# Patient Record
Sex: Female | Born: 1980 | Race: Black or African American | Hispanic: No | Marital: Single | Smoking: Never smoker
Health system: Southern US, Community
[De-identification: ages and names within clinical notes are randomized; demographics above are authoritative.]

## PROBLEM LIST (undated history)

## (undated) DIAGNOSIS — C8590 Non-Hodgkin lymphoma, unspecified, unspecified site: Secondary | ICD-10-CM

## (undated) DIAGNOSIS — Z9221 Personal history of antineoplastic chemotherapy: Secondary | ICD-10-CM

## (undated) DIAGNOSIS — C801 Malignant (primary) neoplasm, unspecified: Secondary | ICD-10-CM

---

## 2009-01-27 ENCOUNTER — Ambulatory Visit: Payer: Self-pay | Admitting: Internal Medicine

## 2013-10-04 ENCOUNTER — Encounter: Payer: Self-pay | Admitting: Maternal & Fetal Medicine

## 2013-10-04 LAB — COMPREHENSIVE METABOLIC PANEL
Albumin: 3.3 g/dL — ABNORMAL LOW (ref 3.4–5.0)
Alkaline Phosphatase: 57 U/L
Anion Gap: 7 (ref 7–16)
BUN: 5 mg/dL — ABNORMAL LOW (ref 7–18)
Bilirubin,Total: 0.5 mg/dL (ref 0.2–1.0)
Calcium, Total: 9.4 mg/dL (ref 8.5–10.1)
Chloride: 104 mmol/L (ref 98–107)
Co2: 25 mmol/L (ref 21–32)
Creatinine: 0.62 mg/dL (ref 0.60–1.30)
EGFR (African American): >60
EGFR (Non-African Amer.): >60
Glucose: 83 mg/dL (ref 65–99)
Osmolality: 268 (ref 275–301)
Potassium: 4 mmol/L (ref 3.5–5.1)
SGOT(AST): 17 U/L (ref 15–37)
SGPT (ALT): 25 U/L (ref 12–78)
Sodium: 136 mmol/L (ref 136–145)
Total Protein: 9.1 g/dL — ABNORMAL HIGH (ref 6.4–8.2)

## 2013-10-04 LAB — HEMOGLOBIN A1C: Hemoglobin A1C: 5.5 % (ref 4.2–6.3)

## 2013-10-15 DIAGNOSIS — D259 Leiomyoma of uterus, unspecified: Secondary | ICD-10-CM | POA: Insufficient documentation

## 2013-10-16 DIAGNOSIS — C8190 Hodgkin lymphoma, unspecified, unspecified site: Secondary | ICD-10-CM | POA: Insufficient documentation

## 2013-10-18 ENCOUNTER — Encounter: Payer: Self-pay | Admitting: Obstetrics and Gynecology

## 2014-01-07 DIAGNOSIS — Z9221 Personal history of antineoplastic chemotherapy: Secondary | ICD-10-CM | POA: Insufficient documentation

## 2014-04-23 DIAGNOSIS — Z98891 History of uterine scar from previous surgery: Secondary | ICD-10-CM | POA: Insufficient documentation

## 2014-07-20 NOTE — Consult Note (Signed)
Referral Information:  Reason for Referral 34 yo gravida 1 at 71w3dby dates is referred due to history of Hodgkin's disease   Referring Physician CLouisa SecondCNM   Home Medications: Medication Instructions Status  Tylenol 500 mg oral tablet 2 tab(s) orally every 6 hours, As Needed - for Pain Active  Prenatal Multivitamins Prenatal Multivitamins oral tablet 1 tab(s) orally once a day Active  folic acid 1 mg oral tablet 1  orally once a day Active  Aspir 81 81 mg oral tablet 1 tab(s) orally once a day Active   Allergies:   No Known Allergies:   Vital Signs/Notes:  Nursing Vital Signs: **Vital Signs.:   09-Jul-15 08:55  Vital Signs Type Routine  Temperature Temperature (F) 98.3  Temperature Source oral  Pulse Pulse 79  Respirations Respirations 18  Systolic BP Systolic BP 1659 Diastolic BP (mmHg) Diastolic BP (mmHg) 57  Pulse Ox % Pulse Ox % 98  Pulse Ox Activity Level  At rest  Oxygen Delivery Room Air/ 21 %   Perinatal Consult:  LMP 16-Jul-2013   PGyn Hx Last Pap 2012   Past Medical History cont'd 1999 - Hodgkin's lymphoma - presented with weight loss, night sweats and abdominal mass.  Underwent exploratory laparotomy. Hospitalized for 4 weeks.  Chemo q 2 weeks for 6 months.  Knows she had adriamycin.  Doesn't know other agents.  Last oncology follow-up was in 2000.  Saw primary care physician one year ago.   PSurg Hx Exploratory laparotomy 1999   FHx No family history of diabetes, heart disease, clots, strokes or birth defects.   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  Twin brother died of hypertensive renal disease.   Soc Hx Engaged.  No substances.   Review Of Systems:  Subjective For last few days has had neuropathic pain radiating down right leg. Getting better.   Fever/Chills No   Cough No   Abdominal Pain No   Diarrhea No   Constipation No   Nausea/Vomiting No    SOB/DOE No   Chest Pain No   Dysuria No   Tolerating Diet Yes   Medications/Allergies Reviewed Medications/Allergies reviewed   Exam:  Today's Weight 280     Hepatic:  09-Jul-15 11:25   Bilirubin, Total 0.5  Alkaline Phosphatase 57 (45-117 NOTE: New Reference Range 02/16/13)  SGPT (ALT) 25  SGOT (AST) 17  Total Protein, Serum  9.1  Albumin, Serum  3.3  Routine Chem:  09-Jul-15 11:25   Hemoglobin A1c ([facility]) 5.5 (The American Diabetes Association recommends that a primary goal of therapy should be <7% and that physicians should reevaluate the treatment regimen in patients with HbA1c values consistently >8%.)  Glucose, Serum 83  BUN  5  Creatinine (comp) 0.62  Sodium, Serum 136  Potassium, Serum 4.0  Chloride, Serum 104  CO2, Serum 25  Calcium (Total), Serum 9.4  Osmolality (calc) 268  eGFR (African American) >60  eGFR (Non-African American) >60 (eGFR values <690mmin/1.73 m2 may be an indication of chronic kidney disease (CKD). Calculated eGFR is useful in patients with stable renal function. The eGFR calculation will not be reliable in acutely ill patients when serum creatinine is changing rapidly. It is not useful in  patients on dialysis. The eGFR calculation may not be applicable to patients at the low and high extremes of body sizes, pregnant women, and vegetarians.)  Anion Gap 7   [place]  09-Jul-15 10:56, MFM OB US < 14 Wks, Single Fetus  MFM OB US < 14 Wks, Single Fetus   Indication: dating.   ____________________________________________________________________________  History: Age: 51 years. [facility] age at [facility]: 46 years.  ____________________________________________________________________________  Dating:  LMP:   07/16/2013     EDC:     04/22/2014     GA by LMP:     [redacted]w[redacted]d Current Scan on:     10/04/2013     EDC:     04/30/2014     GA by current scan:      169w2dBest Overall Assessment:     10/04/2013     EDC:     04/30/2014     Assessed  GA:      1071w2dhe calculation of the gestational age by current scan was based on CRLMarneThe Best Overall Assessment is based on the ultrasound examination on  10/04/2013.  ____________________________________________________________________________  First Trimester Scan:  Singleton gestation.  Biometry:  CRL     36.0     mm      -     10w68w2dtal Anatomy:  Skull / Brain: not examined. Spine: not examined. Heart: not examined. Abdomen:  not examined. Stomach: not examined. Bladder: not visible. Hands: not examined.  Feet: not examined.      Fetal heart activity: present. Fetal heart rate: 152 bpm.     ____________________________________________________________________________  Maternal Structures:  Cervix: Finding: Multiple fibroids seen in uterus.  Right Ovary: normal.   Right Ovary size: 28 mm x 17 mm x 23 mm. Volume: 5.7 ml.   Left Ovary: abnormal.   Left Ovary size: 39 mm x 31 mm x 33 mm. Volume: 20.9 ml.  Comments: Left ovarian cyst which measures 21 X 19 X 20 mm.  There are 4 fibroids:  Left measuring 7 x 7.1 x 7.7 cm  Midline measuring 2.7 cm x 2.2 cm x 2.3 cm  Right  measuring 4.2 x 3.9 x 4.5 cm  Lower uterine segment measuring 4.6 cm x 4.8 cm x 5.7 cm  ____________________________________________________________________________  Report Summary:  Impression: Single intrauterine pregnancy with an estimated gestational age of   10 w24 weeksay(s).  Dating assigned by today's ultrasound.     Referred due to history of Hodgkin's disease. CODING DESCRIPTION:76801  First trimester ultrasound. Unsure LMP.   Recommendations: A full Perinatal Consult will follow.  Given the patient's  multiple risk factors, I [facility] Thank you for allowing us t[place]articipate in her care.  Electronically signed by:  AndrErasmo Score   Impression/Recommendations:  Impression 33 y89gravida 1 at 10w259w2dation dated by US  tKoreaay with: 1. AMA 2. History of Hodgkins with adriamycin exposure and other unknown chemotherapy exposures 3. Obesity 4. Multiple fibroids (7 cm, 3 cm, 4 cm, and 5cm in diameter) 5. Multiple risk factors  for maternal cardiomyopathy (adriamycin exposure, morbid obesity, AMA, AA race)   Recommendations 1. AMA --The patient met with our genetic counselor, DeborDonette Larryy, and elects first trimester screening.  [facility]; other unknown exposures putting patient at possible risk for pulmonary fibrosis; other risk factors for matenal cardiomyopathy  --EKG --Echo --PFTs --Repeat echo at 28-30 weeks --consultation with cardiology prn depending on echo results 3. Morbid obesity --HbA1C today --Early and  routine glucose screening --Monthly [place] starting at [redacted] weeks gestation, weekly testing starting at [redacted] weeks gestation, delivery at 39-[redacted] weeks gestation 4. Multiple fibroids --Reassessment during periodic ultrasounds 5. Multiple risk factors --I would recommend she be delivered at a tertiary care center and have MFM supervision during pregnancy.  I suggested [facility] or [facility].  [facility] in [facility], so we will arrange a transfer of care appointment for her.   Plan:  Genetic Counseling yes, today   Prenatal Diagnosis Options First trimester   Ultrasound at what gestational ages Monthly > 28 weeks   Antepartum Testing Weekly, Starting at 78 to 36 weeks   Delivery Mode Depending on circumstances at the time   Additional Testing HbA1C   Delivery at what gestational age [redacted] weeks   Comment/Plan Thank you for allowing [place] to participate in her care.    Comments I had the opportunity to speak to Dr. Glennon Mac about the recommendations above and he is in agreement.    Total Time Spent with Patient > 60 minutes   >50% of visit spent in couseling/coordination of care yes    Coding Description: MATERNAL CONDITIONS/HISTORY INDICATION(S).   Adv Maternal Age- primigravida.   Fibroids.   Obesity - BMI greater than equal to 30.   OTHER: History of Hodgkin's disease with adriamycin exposure.  Electronic Signatures: Dellia Nims (MD)  (Signed 09-Jul-15 15:10)  Authored: Referral, Home Medications, Allergies, Vital Signs/Notes, Consult, Exam, Lab, Radiology, Impression, Plan, Other Comments, Billing, Coding Description   Last Updated: 09-Jul-15 15:10 by Dellia Nims (MD)

## 2015-07-26 ENCOUNTER — Emergency Department: Payer: BC Managed Care – PPO

## 2015-07-26 ENCOUNTER — Encounter: Payer: Self-pay | Admitting: Emergency Medicine

## 2015-07-26 ENCOUNTER — Emergency Department
Admission: EM | Admit: 2015-07-26 | Discharge: 2015-07-26 | Disposition: A | Discharge status: Home / Self Care | Payer: BC Managed Care – PPO | Attending: Emergency Medicine | Admitting: Emergency Medicine

## 2015-07-26 DIAGNOSIS — Y999 Unspecified external cause status: Secondary | ICD-10-CM | POA: Diagnosis not present

## 2015-07-26 DIAGNOSIS — S8392XA Sprain of unspecified site of left knee, initial encounter: Secondary | ICD-10-CM | POA: Insufficient documentation

## 2015-07-26 DIAGNOSIS — Z8572 Personal history of non-Hodgkin lymphomas: Secondary | ICD-10-CM | POA: Diagnosis not present

## 2015-07-26 DIAGNOSIS — Y9389 Activity, other specified: Secondary | ICD-10-CM | POA: Diagnosis not present

## 2015-07-26 DIAGNOSIS — M79605 Pain in left leg: Secondary | ICD-10-CM | POA: Diagnosis present

## 2015-07-26 DIAGNOSIS — X501XXA Overexertion from prolonged static or awkward postures, initial encounter: Secondary | ICD-10-CM | POA: Diagnosis not present

## 2015-07-26 DIAGNOSIS — Y929 Unspecified place or not applicable: Secondary | ICD-10-CM | POA: Diagnosis not present

## 2015-07-26 HISTORY — DX: Malignant (primary) neoplasm, unspecified: C80.1

## 2015-07-26 HISTORY — DX: Non-Hodgkin lymphoma, unspecified, unspecified site: C85.90

## 2015-07-26 MED ORDER — TRAMADOL HCL 50 MG PO TABS: 50.0000 mg | ORAL_TABLET | Freq: Four times a day (QID) | ORAL | Status: DC | PRN

## 2015-07-26 MED ORDER — TRAMADOL HCL 50 MG PO TABS
50.0000 mg | ORAL_TABLET | Freq: Once | ORAL | Status: AC
  Administered 2015-07-26: 50 mg via ORAL
  Filled 2015-07-26: qty 1

## 2015-07-26 MED ORDER — NAPROXEN 500 MG PO TABS
500.0000 mg | ORAL_TABLET | Freq: Once | ORAL | Status: AC
  Administered 2015-07-26: 500 mg via ORAL
  Filled 2015-07-26: qty 1

## 2015-07-26 MED ORDER — NAPROXEN 500 MG PO TABS: 500.0000 mg | ORAL_TABLET | Freq: Two times a day (BID) | ORAL | Status: DC

## 2015-07-26 NOTE — ED Notes (Addendum)
Pt to ed with c/o left leg pain that started on Tuesday am,  Pt states increased pain with ambulation and ROM makes it worse.   Denies injury.

## 2015-07-26 NOTE — ED Provider Notes (Signed)
[facility] Emergency Department Provider Note  ____________________________________________  Time seen: Approximately 3:43 PM  I have reviewed the triage vital signs and the nursing notes.   HISTORY  Chief Complaint Leg Pain    HPI Christina Sanders is a 35 y.o. female patient complaining of 4 days of left knee pain. Patient stated awaken 4 days ago with this complaint.Patient denies any provocative incident for her complaint. She states a long-standing the day before but went to bed without any pain. Patient stated pain increases with ambulation and flexion of the left knee. Patient stated pain is concentrated in the popliteal space and also the lateral left knee. Patient states no relief with over-the-counter anti-inflammatory medications. Patient rates the pain as a 10 over 10. Patient described a pain as "shooting". Patient is morbidly obese.   Past Medical History  Diagnosis Date  . Cancer ([place])   . Non Hodgkin's lymphoma ([place])     There are no active problems to display for this patient.   History reviewed. No pertinent past surgical history.  Current Outpatient Rx  Name  Route  Sig  Dispense  Refill  . naproxen (NAPROSYN) 500 MG tablet   Oral   Take 1 tablet (500 mg total) by mouth 2 (two) times daily with a meal.   20 tablet   0   . traMADol (ULTRAM) 50 MG tablet   Oral   Take 1 tablet (50 mg total) by mouth every 6 (six) hours as needed for moderate pain.   12 tablet   0     Allergies Heparin  History reviewed. No pertinent family history.  Social History Social History  Substance Use Topics  . Smoking status: Never Smoker   . Smokeless tobacco: None  . Alcohol Use: No    Review of Systems Constitutional: No fever/chills Eyes: No visual changes. ENT: No sore throat. Cardiovascular: Denies chest pain. Respiratory: Denies shortness of breath. Gastrointestinal: No abdominal pain.  No nausea, no vomiting.  No diarrhea.  No  constipation. Genitourinary: Negative for dysuria. Musculoskeletal: Positive for left knee pain  Skin: Negative for rash. Neurological: Negative for headaches, focal weakness or numbness. Hematological/Lymphatic:Non-Hodgkin's lymphoma  Allergic/Immunilogical: Heparin ____________________________________________   PHYSICAL EXAM:  VITAL SIGNS: ED Triage Vitals  Enc Vitals Group     BP 07/26/15 1412 137/85 mmHg     Pulse Rate 07/26/15 1412 84     Resp 07/26/15 1412 18     Temp 07/26/15 1412 97.8 F (36.6 C)     Temp Source 07/26/15 1412 Oral     SpO2 07/26/15 1412 100 %     Weight 07/26/15 1412 287 lb (130.182 kg)     Height 07/26/15 1412 5\' 5"  (1.651 m)     Head Cir --      Peak Flow --      Pain Score 07/26/15 1413 10     Pain Loc --      Pain Edu? --      Excl. in [place]? --     Constitutional: Alert and oriented. Well appearing and in no acute distress. Morbid obesity Eyes: Conjunctivae are normal. PERRL. EOMI. Head: Atraumatic. Nose: No congestion/rhinnorhea. Mouth/Throat: Mucous membranes are moist.  Oropharynx non-erythematous. Neck: No stridor.   cervical spine tenderness to palpation. Hematological/Lymphatic/Immunilogical: No cervical lymphadenopathy. Cardiovascular: Normal rate, regular rhythm. Grossly normal heart sounds.  Good peripheral circulation. Respiratory: Normal respiratory effort.  No retractions. Lungs CTAB. Gastrointestinal: Soft and nontender. No distention. No abdominal bruits. No  CVA tenderness. Musculoskeletal: No obvious deformity of the left lower extremity. Exam is limited by patient body habitus. Patient has some moderate guarding palpation the left popliteal area in the lateral aspect of the inferior patella. Patient is ambulating with atypical gait  Neurologic:  Normal speech and language. No gross focal neurologic deficits are appreciated. No gait instability. Skin:  Skin is warm, dry and intact. No rash noted. Psychiatric: Mood and affect are  normal. Speech and behavior are normal.  ____________________________________________   LABS (all labs ordered are listed, but only abnormal results are displayed)  Labs Reviewed - No data to display ____________________________________________  EKG   ____________________________________________  RADIOLOGY  No acute findings on x-ray of the left knee. Findings consistent with a previous ligament injury. ____________________________________________   PROCEDURES  Procedure(s) performed: None  Critical Care performed: No  ____________________________________________   INITIAL IMPRESSION / ASSESSMENT AND PLAN / ED COURSE  Pertinent labs & imaging results that were available during my care of the patient were reviewed by me and considered in my medical decision making (see chart for details).  Sprain left knee. Discussed x-ray findings with patient. Patient given discharge care instructions. Patient given prescription for naproxen and tramadol. Patient advised follow "clinic if no improvement in 3-5 days. ____________________________________________   FINAL CLINICAL IMPRESSION(S) / ED DIAGNOSES  Final diagnoses:  Left knee sprain, initial encounter      Sable Feil, PA-C 07/26/15 [place], MD 07/27/15 [phone]

## 2016-06-06 ENCOUNTER — Emergency Department (HOSPITAL_COMMUNITY)
Admission: EM | Admit: 2016-06-06 | Discharge: 2016-06-06 | Disposition: A | Discharge status: Home / Self Care | Payer: BC Managed Care – PPO | Attending: Emergency Medicine | Admitting: Emergency Medicine

## 2016-06-06 ENCOUNTER — Emergency Department (HOSPITAL_COMMUNITY): Payer: BC Managed Care – PPO

## 2016-06-06 ENCOUNTER — Encounter (HOSPITAL_COMMUNITY): Payer: Self-pay | Admitting: Emergency Medicine

## 2016-06-06 DIAGNOSIS — M25511 Pain in right shoulder: Secondary | ICD-10-CM | POA: Diagnosis not present

## 2016-06-06 DIAGNOSIS — R202 Paresthesia of skin: Secondary | ICD-10-CM

## 2016-06-06 DIAGNOSIS — Z8572 Personal history of non-Hodgkin lymphomas: Secondary | ICD-10-CM | POA: Diagnosis not present

## 2016-06-06 DIAGNOSIS — Z79899 Other long term (current) drug therapy: Secondary | ICD-10-CM | POA: Insufficient documentation

## 2016-06-06 NOTE — ED Notes (Signed)
Pt c/o hand being weak since Thursday and feeling weak in right arm. Pt reports numbness and pain. She states she has to keep hand by side, decreased ROM.

## 2016-06-06 NOTE — ED Notes (Signed)
ED Provider at bedside. 

## 2016-06-06 NOTE — Discharge Instructions (Signed)
Remove sling multiple times a day to stretch your arm.

## 2016-06-06 NOTE — ED Triage Notes (Signed)
Pt presents to ED for assessment of intermittent finger tingling/numbness, with right posterior arm pain and shoulder pain radiating up to her neck.  Pt denies any injury.  Patient seen at [facility] earlier today for same.

## 2016-06-06 NOTE — ED Notes (Signed)
Pt denies symptoms of blurry vision, N/V, or headaches.

## 2016-06-06 NOTE — ED Provider Notes (Signed)
[facility] DEPT Provider Note   CSN: [id number] Arrival date & time: 06/06/16  1944   By signing my name below, I, Christina Sanders, attest that this documentation has been prepared under the direction and in the presence of Wyn Quaker, PA-C Electronically Signed: [place], ED Scribe. 06/06/16. 9:02 PM.  History   Chief Complaint Chief Complaint  Patient presents with  . Shoulder Pain  . Arm Injury    HPI Christina Sanders is a 36 y.o. female with a PMHx of non-hodgkin's lymphoma, CA, who presents to the Emergency Department complaining of right shoulder pain onset 1 year worsening 3 days ago. Pt reports associated tingling sensation to right fingertips. Pt has tried Rx naprosyn, aleve, icy-hot patch, and warm showers with no relief of her symptoms. Pt right shoulder pain radiates to her right fingers and she describes it as a shooting sensation. She notes that her right shoulder pain is worsened with laying down and movement. 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 She states that she is right hand dominant. She states that she was evaluated at Urgent Care this morning, prescribed naproxen, and informed to follow up in the ED for worsening symptoms. She denies numbness, bowel/bladder incontinence, HA, blurred vision, vision change, nausea, vomiting, diarrhea, cough, sneezing and any other symptoms.     The history is provided by the patient. No language interpreter was used.    Past Medical History:  Diagnosis Date  . Cancer ([place])   . Non Hodgkin's lymphoma ([place])     There are no active problems to display for this patient.   History reviewed. No pertinent surgical history.  OB History    Gravida Para Term Preterm AB Living   1         1   SAB TAB Ectopic Multiple Live Births                   Home Medications    Prior to Admission medications   Medication Sig Start Date End Date Taking? Authorizing Provider    naproxen (NAPROSYN) 500 MG tablet Take 1 tablet (500 mg total) by mouth 2 (two) times daily with a meal. 07/26/15   Sable Feil, PA-C  traMADol (ULTRAM) 50 MG tablet Take 1 tablet (50 mg total) by mouth every 6 (six) hours as needed for moderate pain. 07/26/15   Sable Feil, PA-C    Family History History reviewed. No pertinent family history.  Social History Social History  Substance Use Topics  . Smoking status: Never Smoker  . Smokeless tobacco: Never Used  . Alcohol use No     Allergies   Heparin   Review of Systems Review of Systems  Constitutional: Negative for fever.  HENT: Negative for sneezing.   Eyes: Negative for visual disturbance.  Respiratory: Negative for cough.   Cardiovascular: Negative for chest pain.  Gastrointestinal: Negative for diarrhea, nausea and vomiting.  Musculoskeletal: Positive for arthralgias (right shoulder) and myalgias. Negative for back pain, gait problem and joint swelling.  Skin: Negative for color change.  Neurological: Negative for numbness and headaches.       +Tingling to right fingers      Physical Exam Updated Vital Signs BP (!) 137/103 (BP Location: Left Arm)   Pulse 87   Temp 99.2 F (37.3 C) (Oral)   Resp 18   LMP 05/27/2016   SpO2 100%   Physical Exam  Constitutional: She is oriented to  person, place, and time. She appears well-developed and well-nourished. No distress.  HENT:  Head: Normocephalic and atraumatic.  Eyes: EOM are normal.  Neck: Neck supple.  Cardiovascular: Normal rate.   Pulmonary/Chest: Effort normal. No respiratory distress.  Abdominal: She exhibits no distension.  Musculoskeletal: She exhibits tenderness.       Right shoulder: She exhibits decreased range of motion and tenderness. She exhibits no deformity.  TTP right shoulder. Good grip strength. Good ROM to hands and wrist. Extremities are warm and well perfused. ROM of R shoulder limited secondary to pain.  Passive ROM of R elbow  intact.  Neurological: She is alert and oriented to person, place, and time.  Skin: Skin is warm and dry. She is not diaphoretic.  Psychiatric: She has a normal mood and affect. Her behavior is normal.  Nursing note and vitals reviewed.    ED Treatments / Results  DIAGNOSTIC STUDIES: Oxygen Saturation is 100% on RA, nl by my interpretation.    COORDINATION OF CARE: 9:01 PM Discussed treatment plan with pt at bedside which includes right shoulder xray and pt agreed to plan.  Radiology Dg Shoulder Right  Result Date: 06/06/2016 CLINICAL DATA:  Pain, numbness, and tingling. Right posterior arm and shoulder pain radiating to the neck. Seen at urgent care center earlier today for same. EXAM: RIGHT SHOULDER - 2+ VIEW COMPARISON:  None. FINDINGS: Examination is limited due to body habitus and patient positioning. There is no evidence of fracture or dislocation. There is no evidence of arthropathy or other focal bone abnormality. Soft tissues are unremarkable. IMPRESSION: No acute bony abnormalities identified. Electronically Signed   By: Lucienne Capers M.D.   On: 06/06/2016 21:16    Procedures Procedures (including critical care time)  Medications Ordered in ED Medications - No data to display   Initial Impression / Assessment and Plan / ED Course  I have reviewed the triage vital signs and the nursing notes.  Pertinent imaging results that were available during my care of the patient were reviewed by me and considered in my medical decision making (see chart for details).   MDM: pain and spasm noted right shoulder area. PE shows no instability,  or deformity of acromioclavicular and sternoclavicular joints, the cervical spine, glenohumeral joint, coracoid process, acromion, or scapula. Exam Limited by body habitus. Patient has no history of trauma, and has increased use of R extremity at work.  It is felt that this pain is secondary to overuse and increase in repetitive motions.  Patient  given a sling for comfort with strict instructions to remove it multiple times a day and perform light stretching exercises to avoid frozen shoulder. While she has the neurological symptom of tingling and pain shooting down her arm, she has no other neurological symptoms that would suggest a central neurological cause.  Patient was instructed to use OTC NSAIDS for pain and inflammation control along with heat and rest.  Patient stable for discharge, given strict return precautions and voiced her understanding.   Final Clinical Impressions(s) / ED Diagnoses   Final diagnoses:  Right shoulder pain, unspecified chronicity  Tingling of right upper extremity    New Prescriptions Discharge Medication List as of 06/06/2016  9:40 PM     I personally performed the services described in this documentation, which was scribed in my presence. The recorded information has been reviewed and is accurate. Lorin Glass, [place], [place] 06/06/16 2226    Charlesetta Shanks, MD  06/08/16 0817  

## 2016-06-06 NOTE — ED Notes (Addendum)
Went to get pt from waiting room and pt is in radiology, they will bring her to room when they are done.

## 2016-06-06 NOTE — Progress Notes (Signed)
Orthopedic Tech Progress Note Patient Details:  Christina Sanders 1981-03-29 [id number]  Ortho Devices Type of Ortho Device: Arm sling Ortho Device/Splint Location: rue Ortho Device/Splint Interventions: Ordered, Application   Karolee Stamps 06/06/2016, 10:11 PM

## 2019-02-05 ENCOUNTER — Other Ambulatory Visit: Payer: Self-pay

## 2019-02-05 DIAGNOSIS — Z20822 Contact with and (suspected) exposure to covid-19: Secondary | ICD-10-CM

## 2019-02-06 LAB — NOVEL CORONAVIRUS, NAA: SARS-CoV-2, NAA: NOT DETECTED

## 2019-04-30 ENCOUNTER — Other Ambulatory Visit: Payer: Self-pay | Admitting: Family Medicine

## 2019-06-20 ENCOUNTER — Other Ambulatory Visit: Payer: Self-pay | Admitting: Family Medicine

## 2019-06-20 DIAGNOSIS — Z1231 Encounter for screening mammogram for malignant neoplasm of breast: Secondary | ICD-10-CM

## 2019-08-24 ENCOUNTER — Encounter: Payer: Self-pay | Admitting: Radiology

## 2019-08-24 ENCOUNTER — Ambulatory Visit
Admission: RE | Admit: 2019-08-24 | Discharge: 2019-08-24 | Disposition: A | Discharge status: Home / Self Care | Payer: BC Managed Care – PPO | Source: Ambulatory Visit | Attending: Family Medicine | Admitting: Family Medicine

## 2019-08-24 DIAGNOSIS — Z1231 Encounter for screening mammogram for malignant neoplasm of breast: Secondary | ICD-10-CM | POA: Insufficient documentation

## 2019-08-24 HISTORY — DX: Personal history of antineoplastic chemotherapy: Z92.21

## 2019-08-28 ENCOUNTER — Other Ambulatory Visit: Payer: Self-pay | Admitting: Family Medicine

## 2019-08-28 DIAGNOSIS — N6310 Unspecified lump in the right breast, unspecified quadrant: Secondary | ICD-10-CM

## 2019-08-28 DIAGNOSIS — N6320 Unspecified lump in the left breast, unspecified quadrant: Secondary | ICD-10-CM

## 2019-08-28 DIAGNOSIS — R928 Other abnormal and inconclusive findings on diagnostic imaging of breast: Secondary | ICD-10-CM

## 2019-08-29 ENCOUNTER — Ambulatory Visit
Admission: RE | Admit: 2019-08-29 | Discharge: 2019-08-29 | Disposition: A | Discharge status: Home / Self Care | Payer: BC Managed Care – PPO | Source: Ambulatory Visit | Attending: Family Medicine | Admitting: Family Medicine

## 2019-08-29 DIAGNOSIS — N6310 Unspecified lump in the right breast, unspecified quadrant: Secondary | ICD-10-CM

## 2019-08-29 DIAGNOSIS — N6320 Unspecified lump in the left breast, unspecified quadrant: Secondary | ICD-10-CM

## 2019-08-29 DIAGNOSIS — R928 Other abnormal and inconclusive findings on diagnostic imaging of breast: Secondary | ICD-10-CM

## 2019-08-31 ENCOUNTER — Other Ambulatory Visit: Payer: Self-pay | Admitting: Family Medicine

## 2019-08-31 DIAGNOSIS — N6320 Unspecified lump in the left breast, unspecified quadrant: Secondary | ICD-10-CM

## 2019-08-31 DIAGNOSIS — R928 Other abnormal and inconclusive findings on diagnostic imaging of breast: Secondary | ICD-10-CM

## 2019-09-04 ENCOUNTER — Ambulatory Visit
Admission: RE | Admit: 2019-09-04 | Discharge: 2019-09-04 | Disposition: A | Discharge status: Home / Self Care | Payer: BC Managed Care – PPO | Source: Ambulatory Visit | Attending: Family Medicine | Admitting: Family Medicine

## 2019-09-04 DIAGNOSIS — N6320 Unspecified lump in the left breast, unspecified quadrant: Secondary | ICD-10-CM | POA: Diagnosis present

## 2019-09-04 DIAGNOSIS — R928 Other abnormal and inconclusive findings on diagnostic imaging of breast: Secondary | ICD-10-CM

## 2019-09-04 HISTORY — PX: BREAST BIOPSY: SHX20

## 2019-09-06 LAB — SURGICAL PATHOLOGY

## 2020-04-30 ENCOUNTER — Other Ambulatory Visit: Payer: Self-pay | Admitting: Family Medicine

## 2020-04-30 DIAGNOSIS — R928 Other abnormal and inconclusive findings on diagnostic imaging of breast: Secondary | ICD-10-CM

## 2020-05-08 ENCOUNTER — Other Ambulatory Visit: Payer: Self-pay

## 2020-05-08 ENCOUNTER — Other Ambulatory Visit: Payer: Self-pay | Admitting: Family Medicine

## 2020-05-08 ENCOUNTER — Ambulatory Visit
Admission: RE | Admit: 2020-05-08 | Discharge: 2020-05-08 | Disposition: A | Discharge status: Home / Self Care | Payer: BC Managed Care – PPO | Source: Ambulatory Visit | Attending: Family Medicine | Admitting: Family Medicine

## 2020-05-08 DIAGNOSIS — N6320 Unspecified lump in the left breast, unspecified quadrant: Secondary | ICD-10-CM

## 2020-05-08 DIAGNOSIS — N6310 Unspecified lump in the right breast, unspecified quadrant: Secondary | ICD-10-CM | POA: Insufficient documentation

## 2020-05-08 DIAGNOSIS — R928 Other abnormal and inconclusive findings on diagnostic imaging of breast: Secondary | ICD-10-CM | POA: Insufficient documentation

## 2020-05-14 ENCOUNTER — Other Ambulatory Visit: Payer: Self-pay | Admitting: Family Medicine

## 2020-05-14 DIAGNOSIS — N6320 Unspecified lump in the left breast, unspecified quadrant: Secondary | ICD-10-CM

## 2020-05-14 DIAGNOSIS — R928 Other abnormal and inconclusive findings on diagnostic imaging of breast: Secondary | ICD-10-CM

## 2020-05-21 ENCOUNTER — Ambulatory Visit
Admission: RE | Admit: 2020-05-21 | Discharge: 2020-05-21 | Disposition: A | Discharge status: Home / Self Care | Payer: BC Managed Care – PPO | Source: Ambulatory Visit | Attending: Family Medicine | Admitting: Family Medicine

## 2020-05-21 ENCOUNTER — Other Ambulatory Visit: Payer: Self-pay

## 2020-05-21 DIAGNOSIS — N6320 Unspecified lump in the left breast, unspecified quadrant: Secondary | ICD-10-CM | POA: Diagnosis present

## 2020-05-21 DIAGNOSIS — R928 Other abnormal and inconclusive findings on diagnostic imaging of breast: Secondary | ICD-10-CM | POA: Diagnosis not present

## 2020-05-21 HISTORY — PX: BREAST BIOPSY: SHX20

## 2020-05-23 ENCOUNTER — Encounter: Payer: Self-pay | Admitting: Family Medicine

## 2020-05-23 LAB — SURGICAL PATHOLOGY

## 2020-07-04 ENCOUNTER — Inpatient Hospital Stay
Admission: EM | Admit: 2020-07-04 | Discharge: 2020-07-09 | DRG: 854 | Disposition: A | Discharge status: Home / Self Care | Payer: BC Managed Care – PPO | Attending: Internal Medicine | Admitting: Internal Medicine

## 2020-07-04 ENCOUNTER — Emergency Department: Payer: BC Managed Care – PPO

## 2020-07-04 ENCOUNTER — Other Ambulatory Visit: Payer: Self-pay

## 2020-07-04 DIAGNOSIS — Z20822 Contact with and (suspected) exposure to covid-19: Secondary | ICD-10-CM | POA: Diagnosis present

## 2020-07-04 DIAGNOSIS — A059 Bacterial foodborne intoxication, unspecified: Secondary | ICD-10-CM | POA: Diagnosis present

## 2020-07-04 DIAGNOSIS — Z8571 Personal history of Hodgkin lymphoma: Secondary | ICD-10-CM

## 2020-07-04 DIAGNOSIS — Z79899 Other long term (current) drug therapy: Secondary | ICD-10-CM

## 2020-07-04 DIAGNOSIS — R109 Unspecified abdominal pain: Secondary | ICD-10-CM | POA: Diagnosis not present

## 2020-07-04 DIAGNOSIS — A419 Sepsis, unspecified organism: Principal | ICD-10-CM | POA: Diagnosis present

## 2020-07-04 DIAGNOSIS — Z9221 Personal history of antineoplastic chemotherapy: Secondary | ICD-10-CM

## 2020-07-04 DIAGNOSIS — K436 Other and unspecified ventral hernia with obstruction, without gangrene: Secondary | ICD-10-CM

## 2020-07-04 DIAGNOSIS — R8281 Pyuria: Secondary | ICD-10-CM | POA: Diagnosis present

## 2020-07-04 DIAGNOSIS — R0602 Shortness of breath: Secondary | ICD-10-CM

## 2020-07-04 DIAGNOSIS — Z888 Allergy status to other drugs, medicaments and biological substances status: Secondary | ICD-10-CM

## 2020-07-04 DIAGNOSIS — C8590 Non-Hodgkin lymphoma, unspecified, unspecified site: Secondary | ICD-10-CM | POA: Diagnosis present

## 2020-07-04 DIAGNOSIS — E876 Hypokalemia: Secondary | ICD-10-CM | POA: Diagnosis present

## 2020-07-04 DIAGNOSIS — Z6841 Body Mass Index (BMI) 40.0 and over, adult: Secondary | ICD-10-CM

## 2020-07-04 LAB — COMPREHENSIVE METABOLIC PANEL
ALT: 18 U/L (ref 0–44)
AST: 20 U/L (ref 15–41)
Albumin: 3.9 g/dL (ref 3.5–5.0)
Alkaline Phosphatase: 62 U/L (ref 38–126)
Anion gap: 11 (ref 5–15)
BUN: 12 mg/dL (ref 6–20)
CO2: 23 mmol/L (ref 22–32)
Calcium: 8.9 mg/dL (ref 8.9–10.3)
Chloride: 103 mmol/L (ref 98–111)
Creatinine, Ser: 0.84 mg/dL (ref 0.44–1.00)
GFR, Estimated: >60 mL/min (ref >60)
Glucose, Bld: 111 mg/dL — ABNORMAL HIGH (ref 70–99)
Potassium: 3.4 mmol/L — ABNORMAL LOW (ref 3.5–5.1)
Sodium: 137 mmol/L (ref 135–145)
Total Bilirubin: 0.7 mg/dL (ref 0.3–1.2)
Total Protein: 8.8 g/dL — ABNORMAL HIGH (ref 6.5–8.1)

## 2020-07-04 LAB — HCG, QUANTITATIVE, PREGNANCY: hCG, Beta Chain, Quant, S: 1 m[IU]/mL (ref <5)

## 2020-07-04 LAB — CBC
HCT: 37.6 % (ref 36.0–46.0)
Hemoglobin: 12.2 g/dL (ref 12.0–15.0)
MCH: 25.8 pg — ABNORMAL LOW (ref 26.0–34.0)
MCHC: 32.4 g/dL (ref 30.0–36.0)
MCV: 79.5 fL — ABNORMAL LOW (ref 80.0–100.0)
Platelets: 325 10*3/uL (ref 150–400)
RBC: 4.73 MIL/uL (ref 3.87–5.11)
RDW: 15.6 % — ABNORMAL HIGH (ref 11.5–15.5)
WBC: 15.7 10*3/uL — ABNORMAL HIGH (ref 4.0–10.5)
nRBC: 0 % (ref 0.0–0.2)

## 2020-07-04 LAB — LIPASE, BLOOD: Lipase: 34 U/L (ref 11–51)

## 2020-07-04 MED ORDER — ACETAMINOPHEN 500 MG PO TABS: 1000.0000 mg | ORAL_TABLET | Freq: Once | ORAL | Status: AC

## 2020-07-04 MED ORDER — IOHEXOL 300 MG/ML  SOLN
125.0000 mL | Freq: Once | INTRAMUSCULAR | Status: AC | PRN
  Administered 2020-07-04: 125 mL via INTRAVENOUS

## 2020-07-04 MED ORDER — ACETAMINOPHEN 325 MG PO TABS: 650.0000 mg | ORAL_TABLET | Freq: Once | ORAL | Status: DC | PRN

## 2020-07-04 MED ORDER — MORPHINE SULFATE (PF) 4 MG/ML IV SOLN
4.0000 mg | Freq: Once | INTRAVENOUS | Status: AC
  Administered 2020-07-04: 4 mg via INTRAVENOUS
  Filled 2020-07-04: qty 1

## 2020-07-04 MED ORDER — ONDANSETRON HCL 4 MG/2ML IJ SOLN
4.0000 mg | Freq: Once | INTRAMUSCULAR | Status: AC
  Administered 2020-07-04: 4 mg via INTRAVENOUS
  Filled 2020-07-04: qty 2

## 2020-07-04 MED ORDER — ACETAMINOPHEN 500 MG PO TABS
ORAL_TABLET | ORAL | Status: AC
  Administered 2020-07-04: 1000 mg via ORAL
  Filled 2020-07-04: qty 2

## 2020-07-04 MED ORDER — SODIUM CHLORIDE 0.9 % IV BOLUS
1000.0000 mL | Freq: Once | INTRAVENOUS | Status: AC
  Administered 2020-07-04: 1000 mL via INTRAVENOUS

## 2020-07-04 NOTE — ED Triage Notes (Signed)
Pt arrives at ED from home via [facility] with c/c of abdominal pain and fever beginning around lunchtime today. Pt states she ate a salad and started feeling bad after that. Pt states she has had multiple episodes of diarrhea but denies vomiting. EMS reports transport vitals of 172/121, p110, O2 sat 99%, on room air, CBG 115. Pt alert and oriented upon arrival. Claims pain in abdomen and headache.

## 2020-07-04 NOTE — ED Provider Notes (Signed)
[facility] Emergency Department Provider Note   ____________________________________________   I have reviewed the triage vital signs and the nursing notes.   HISTORY  Chief Complaint Fever and Abdominal Pain (With diarrhea/)   History limited by: Not Limited   HPI Christina Sanders is a 40 y.o. female who presents to the emergency department today because of concern for abdominal pain. The patient states that the symptoms started earlier this afternoon. The patient had eaten a salad and then the symptoms started shortly thereafter. Started developing generalized abdominal cramping, however it is somewhat worse in the lower abdomen. Additionally the patient had associated nausea and diarrhea. The patient also developed chills.    Records reviewed. Per medical record review patient has a history of non hodgkin's lymphoma.   Past Medical History:  Diagnosis Date  . Cancer ([place])   . Non Hodgkin's lymphoma ([place])   . Personal history of chemotherapy     There are no problems to display for this patient.   Past Surgical History:  Procedure Laterality Date  . BREAST BIOPSY Left 09/04/2019   US biopsy, venus clip, path pending   . BREAST BIOPSY Left 09/04/2019   Node biopsy, butterfly clip, path pending   . BREAST BIOPSY Left 05/21/2020   [place] bx/ coil clip/ path pending    Prior to Admission medications   Medication Sig Start Date End Date Taking? Authorizing Provider  naproxen (NAPROSYN) 500 MG tablet Take 1 tablet (500 mg total) by mouth 2 (two) times daily with a meal. 07/26/15   Sable Feil, PA-C  traMADol (ULTRAM) 50 MG tablet Take 1 tablet (50 mg total) by mouth every 6 (six) hours as needed for moderate pain. 07/26/15   Sable Feil, PA-C    Allergies Heparin  History reviewed. No pertinent family history.  Social History Social History   Tobacco Use  . Smoking status: Never Smoker  . Smokeless tobacco: Never Used  Substance Use  Topics  . Alcohol use: No  . Drug use: No    Review of Systems Constitutional: Positive for chills.  Eyes: No visual changes. ENT: No sore throat. Cardiovascular: Denies chest pain. Respiratory: Denies shortness of breath. Gastrointestinal: Positive for abdominal pain, nausea, diarrhea.   Genitourinary: Negative for dysuria. Musculoskeletal: Negative for back pain. Skin: Negative for rash. Neurological: Negative for headaches, focal weakness or numbness.  ____________________________________________   PHYSICAL EXAM:  VITAL SIGNS: ED Triage Vitals  Enc Vitals Group     BP 07/04/20 2034 (!) 142/72     Pulse Rate 07/04/20 2034 (!) 110     Resp 07/04/20 2034 19     Temp 07/04/20 2034 (!) 103.3 F (39.6 C)     Temp Source 07/04/20 2034 Oral     SpO2 07/04/20 2046 99 %     Weight 07/04/20 2036 (!) 306 lb (138.8 kg)     Height 07/04/20 2036 5\' 4"  (1.626 m)     Head Circumference --      Peak Flow --      Pain Score 07/04/20 2035 8   Constitutional: Alert and oriented.  Eyes: Conjunctivae are normal.  ENT      Head: Normocephalic and atraumatic.      Nose: No congestion/rhinnorhea.      Mouth/Throat: Mucous membranes are moist.      Neck: No stridor. Hematological/Lymphatic/Immunilogical: No cervical lymphadenopathy. Cardiovascular: Normal rate, regular rhythm.  No murmurs, rubs, or gallops.  Respiratory: Normal respiratory effort without tachypnea nor  retractions. Breath sounds are clear and equal bilaterally. No wheezes/rales/rhonchi. Gastrointestinal: Soft somewhat diffusely tender to palpation. Firm umbilical hernia.  Genitourinary: Deferred Musculoskeletal: Normal range of motion in all extremities. No lower extremity edema. Neurologic:  Normal speech and language. No gross focal neurologic deficits are appreciated.  Skin:  Skin is warm, dry and intact. No rash noted. Psychiatric: Mood and affect are normal. Speech and behavior are normal. Patient exhibits  appropriate insight and judgment.  ____________________________________________    LABS (pertinent positives/negatives)  CBC wbc 15.7, hgb 12.2, plt 325 CMP na 137, k 3.4, glu 111, cr 0.84 ____________________________________________   EKG  None  ____________________________________________    RADIOLOGY  CT abd/pel pending  ____________________________________________   PROCEDURES  Procedures  ____________________________________________   INITIAL IMPRESSION / ASSESSMENT AND PLAN / ED COURSE  Pertinent labs & imaging results that were available during my care of the patient were reviewed by me and considered in my medical decision making (see chart for details).   Patient presented to the emergency department today because of concerns for abdominal pain.  He did start slightly after eating a salad.  On exam patient does have some mild diffuse abdominal pain.  Does have an umbilical hernia.  This is somewhat firm.  Patient's blood work is concerning for leukocytosis and patient did have fever.  Do have some for concern for gallbladder disease however given presence of hernia and diffuse abdominal pain will start imaging with CT scan.  ____________________________________________   FINAL CLINICAL IMPRESSION(S) / ED DIAGNOSES  Abdominal Pain  Note: This dictation was prepared with Dragon dictation. Any transcriptional errors that result from this process are unintentional     Nance Pear, MD 07/05/20 [phone]

## 2020-07-05 DIAGNOSIS — K436 Other and unspecified ventral hernia with obstruction, without gangrene: Secondary | ICD-10-CM | POA: Diagnosis present

## 2020-07-05 DIAGNOSIS — R109 Unspecified abdominal pain: Secondary | ICD-10-CM | POA: Diagnosis present

## 2020-07-05 DIAGNOSIS — A419 Sepsis, unspecified organism: Secondary | ICD-10-CM | POA: Diagnosis present

## 2020-07-05 DIAGNOSIS — C8590 Non-Hodgkin lymphoma, unspecified, unspecified site: Secondary | ICD-10-CM

## 2020-07-05 DIAGNOSIS — Z79899 Other long term (current) drug therapy: Secondary | ICD-10-CM | POA: Diagnosis not present

## 2020-07-05 DIAGNOSIS — K529 Noninfective gastroenteritis and colitis, unspecified: Secondary | ICD-10-CM | POA: Diagnosis not present

## 2020-07-05 DIAGNOSIS — I10 Essential (primary) hypertension: Secondary | ICD-10-CM | POA: Diagnosis not present

## 2020-07-05 DIAGNOSIS — E669 Obesity, unspecified: Secondary | ICD-10-CM | POA: Diagnosis not present

## 2020-07-05 DIAGNOSIS — Z9221 Personal history of antineoplastic chemotherapy: Secondary | ICD-10-CM | POA: Diagnosis not present

## 2020-07-05 DIAGNOSIS — K660 Peritoneal adhesions (postprocedural) (postinfection): Secondary | ICD-10-CM | POA: Diagnosis not present

## 2020-07-05 DIAGNOSIS — N390 Urinary tract infection, site not specified: Secondary | ICD-10-CM | POA: Diagnosis not present

## 2020-07-05 DIAGNOSIS — Z6841 Body Mass Index (BMI) 40.0 and over, adult: Secondary | ICD-10-CM | POA: Diagnosis not present

## 2020-07-05 DIAGNOSIS — Z888 Allergy status to other drugs, medicaments and biological substances status: Secondary | ICD-10-CM | POA: Diagnosis not present

## 2020-07-05 DIAGNOSIS — Z20822 Contact with and (suspected) exposure to covid-19: Secondary | ICD-10-CM | POA: Diagnosis present

## 2020-07-05 DIAGNOSIS — A059 Bacterial foodborne intoxication, unspecified: Secondary | ICD-10-CM | POA: Diagnosis present

## 2020-07-05 DIAGNOSIS — J189 Pneumonia, unspecified organism: Secondary | ICD-10-CM | POA: Diagnosis not present

## 2020-07-05 DIAGNOSIS — Z8571 Personal history of Hodgkin lymphoma: Secondary | ICD-10-CM | POA: Diagnosis not present

## 2020-07-05 DIAGNOSIS — R8281 Pyuria: Secondary | ICD-10-CM | POA: Diagnosis present

## 2020-07-05 DIAGNOSIS — E876 Hypokalemia: Secondary | ICD-10-CM | POA: Diagnosis present

## 2020-07-05 DIAGNOSIS — R198 Other specified symptoms and signs involving the digestive system and abdomen: Secondary | ICD-10-CM | POA: Diagnosis not present

## 2020-07-05 LAB — CBC WITH DIFFERENTIAL/PLATELET
Abs Immature Granulocytes: 0.15 10*3/uL — ABNORMAL HIGH (ref 0.00–0.07)
Basophils Absolute: 0 10*3/uL (ref 0.0–0.1)
Basophils Relative: 0 %
Eosinophils Absolute: 0 10*3/uL (ref 0.0–0.5)
Eosinophils Relative: 0 %
HCT: 33.3 % — ABNORMAL LOW (ref 36.0–46.0)
Hemoglobin: 10.9 g/dL — ABNORMAL LOW (ref 12.0–15.0)
Immature Granulocytes: 1 %
Lymphocytes Relative: 3 %
Lymphs Abs: 0.5 10*3/uL — ABNORMAL LOW (ref 0.7–4.0)
MCH: 26.1 pg (ref 26.0–34.0)
MCHC: 32.7 g/dL (ref 30.0–36.0)
MCV: 79.7 fL — ABNORMAL LOW (ref 80.0–100.0)
Monocytes Absolute: 0.4 10*3/uL (ref 0.1–1.0)
Monocytes Relative: 2 %
Neutro Abs: 17.5 10*3/uL — ABNORMAL HIGH (ref 1.7–7.7)
Neutrophils Relative %: 94 %
Platelets: 263 10*3/uL (ref 150–400)
RBC: 4.18 MIL/uL (ref 3.87–5.11)
RDW: 15.7 % — ABNORMAL HIGH (ref 11.5–15.5)
WBC: 18.6 10*3/uL — ABNORMAL HIGH (ref 4.0–10.5)
nRBC: 0 % (ref 0.0–0.2)

## 2020-07-05 LAB — COMPREHENSIVE METABOLIC PANEL
ALT: 17 U/L (ref 0–44)
AST: 19 U/L (ref 15–41)
Albumin: 3.4 g/dL — ABNORMAL LOW (ref 3.5–5.0)
Alkaline Phosphatase: 53 U/L (ref 38–126)
Anion gap: 9 (ref 5–15)
BUN: 9 mg/dL (ref 6–20)
CO2: 24 mmol/L (ref 22–32)
Calcium: 8.2 mg/dL — ABNORMAL LOW (ref 8.9–10.3)
Chloride: 103 mmol/L (ref 98–111)
Creatinine, Ser: 0.85 mg/dL (ref 0.44–1.00)
GFR, Estimated: >60 mL/min (ref >60)
Glucose, Bld: 111 mg/dL — ABNORMAL HIGH (ref 70–99)
Potassium: 3.3 mmol/L — ABNORMAL LOW (ref 3.5–5.1)
Sodium: 136 mmol/L (ref 135–145)
Total Bilirubin: 0.7 mg/dL (ref 0.3–1.2)
Total Protein: 8 g/dL (ref 6.5–8.1)

## 2020-07-05 LAB — URINALYSIS, COMPLETE (UACMP) WITH MICROSCOPIC
Bilirubin Urine: NEGATIVE
Glucose, UA: NEGATIVE mg/dL
Ketones, ur: NEGATIVE mg/dL
Nitrite: NEGATIVE
Protein, ur: 30 mg/dL — AB
Specific Gravity, Urine: 1.017 (ref 1.005–1.030)
pH: 6 (ref 5.0–8.0)

## 2020-07-05 LAB — LACTIC ACID, PLASMA
Lactic Acid, Venous: 1.9 mmol/L (ref 0.5–1.9)
Lactic Acid, Venous: 1.8 mmol/L (ref 0.5–1.9)
Lactic Acid, Venous: 1 mmol/L (ref 0.5–1.9)

## 2020-07-05 LAB — PROCALCITONIN: Procalcitonin: 2.62 ng/mL

## 2020-07-05 LAB — APTT: aPTT: 25 seconds (ref 24–36)

## 2020-07-05 LAB — PROTIME-INR
INR: 1.4 — ABNORMAL HIGH (ref 0.8–1.2)
Prothrombin Time: 16.5 seconds — ABNORMAL HIGH (ref 11.4–15.2)

## 2020-07-05 LAB — HIV ANTIBODY (ROUTINE TESTING W REFLEX): HIV Screen 4th Generation wRfx: NONREACTIVE

## 2020-07-05 LAB — RESP PANEL BY RT-PCR (FLU A&B, COVID) ARPGX2
Influenza A by PCR: NEGATIVE
Influenza B by PCR: NEGATIVE
SARS Coronavirus 2 by RT PCR: NEGATIVE

## 2020-07-05 MED ORDER — IBUPROFEN 400 MG PO TABS
400.0000 mg | ORAL_TABLET | Freq: Once | ORAL | Status: AC
  Administered 2020-07-05: 400 mg via ORAL
  Filled 2020-07-05: qty 1

## 2020-07-05 MED ORDER — ACETAMINOPHEN 500 MG PO TABS
1000.0000 mg | ORAL_TABLET | Freq: Four times a day (QID) | ORAL | Status: DC | PRN
  Administered 2020-07-07 – 2020-07-09 (×2): 1000 mg via ORAL
  Filled 2020-07-05 (×2): qty 2

## 2020-07-05 MED ORDER — SODIUM CHLORIDE 0.9 % IV SOLN: INTRAVENOUS | Status: DC

## 2020-07-05 MED ORDER — METRONIDAZOLE IN NACL 5-0.79 MG/ML-% IV SOLN
500.0000 mg | Freq: Three times a day (TID) | INTRAVENOUS | Status: DC
  Administered 2020-07-05 – 2020-07-07 (×7): 500 mg via INTRAVENOUS
  Filled 2020-07-05 (×9): qty 100

## 2020-07-05 MED ORDER — LACTATED RINGERS IV SOLN: INTRAVENOUS | Status: DC

## 2020-07-05 MED ORDER — MORPHINE SULFATE (PF) 4 MG/ML IV SOLN
4.0000 mg | Freq: Once | INTRAVENOUS | Status: AC
Start: 2020-07-05 — End: 2020-07-05
  Administered 2020-07-05: 4 mg via INTRAVENOUS
  Filled 2020-07-05: qty 1

## 2020-07-05 MED ORDER — PIPERACILLIN-TAZOBACTAM 3.375 G IVPB 30 MIN
3.3750 g | Freq: Once | INTRAVENOUS | Status: AC
  Administered 2020-07-05: 3.375 g via INTRAVENOUS
  Filled 2020-07-05: qty 50

## 2020-07-05 MED ORDER — SODIUM CHLORIDE 0.9 % IV SOLN
2.0000 g | Freq: Three times a day (TID) | INTRAVENOUS | Status: DC
  Administered 2020-07-05 – 2020-07-08 (×9): 2 g via INTRAVENOUS
  Filled 2020-07-05 (×12): qty 2

## 2020-07-05 MED ORDER — SODIUM CHLORIDE 0.9 % IV SOLN: 2.0000 g | Freq: Once | INTRAVENOUS | Status: DC

## 2020-07-05 MED ORDER — ACETAMINOPHEN 650 MG RE SUPP: 650.0000 mg | Freq: Four times a day (QID) | RECTAL | Status: DC | PRN

## 2020-07-05 MED ORDER — ONDANSETRON HCL 4 MG/2ML IJ SOLN: 4.0000 mg | Freq: Four times a day (QID) | INTRAMUSCULAR | Status: DC | PRN

## 2020-07-05 MED ORDER — ENOXAPARIN SODIUM 80 MG/0.8ML ~~LOC~~ SOLN
0.5000 mg/kg | SUBCUTANEOUS | Status: DC
  Filled 2020-07-05: qty 0.8

## 2020-07-05 MED ORDER — ONDANSETRON HCL 4 MG PO TABS: 4.0000 mg | ORAL_TABLET | Freq: Four times a day (QID) | ORAL | Status: DC | PRN

## 2020-07-05 MED ORDER — LACTATED RINGERS IV BOLUS
1000.0000 mL | Freq: Once | INTRAVENOUS | Status: AC
  Administered 2020-07-05: 1000 mL via INTRAVENOUS

## 2020-07-05 MED ORDER — TRAZODONE HCL 50 MG PO TABS
25.0000 mg | ORAL_TABLET | Freq: Every evening | ORAL | Status: DC | PRN
  Administered 2020-07-05 – 2020-07-07 (×2): 25 mg via ORAL
  Filled 2020-07-05 (×2): qty 1

## 2020-07-05 MED ORDER — ENOXAPARIN SODIUM 80 MG/0.8ML ~~LOC~~ SOLN
0.5000 mg/kg | SUBCUTANEOUS | Status: DC
  Administered 2020-07-07 – 2020-07-09 (×3): 70 mg via SUBCUTANEOUS
  Filled 2020-07-05 (×3): qty 0.8

## 2020-07-05 MED ORDER — POTASSIUM CHLORIDE CRYS ER 20 MEQ PO TBCR
20.0000 meq | EXTENDED_RELEASE_TABLET | Freq: Two times a day (BID) | ORAL | Status: AC
  Administered 2020-07-05 – 2020-07-06 (×3): 20 meq via ORAL
  Filled 2020-07-05 (×3): qty 1

## 2020-07-05 MED ORDER — ACETAMINOPHEN 325 MG PO TABS
650.0000 mg | ORAL_TABLET | Freq: Four times a day (QID) | ORAL | Status: DC | PRN
  Administered 2020-07-05 (×2): 650 mg via ORAL
  Filled 2020-07-05 (×2): qty 2

## 2020-07-05 MED ORDER — MORPHINE SULFATE (PF) 2 MG/ML IV SOLN
2.0000 mg | INTRAVENOUS | Status: DC | PRN
  Administered 2020-07-06 – 2020-07-09 (×3): 2 mg via INTRAVENOUS
  Filled 2020-07-05 (×3): qty 1

## 2020-07-05 NOTE — Progress Notes (Signed)
Contacted by EDP, Dr. Alfred Levins, regarding this patient.  Presented to the ED with abdominal pain and diarrhea that started after eating a salad at lunch.  Multiple episodes of diarrhea, but no emesis.  Evaluation in the ED included a CT scan of the abdomen that shows a moderate-sized umbilical hernia with stranding and fluid.  Patient has a history of Hodgkin's lymphoma with a calcified intra-abdominal mass.  No bowel is contained within the hernia and during a prior ED visit ([facility], 2019), the hernia was also present with a very similar appearance. I do not believe the hernia is a contributing factor to her current presentation.  If admission is indicated, would admit to hospital medicine service.  General surgery is happy to evaluate the hernia further, but there does not seem to be any need for urgent surgical intervention and would advise evaluation for other etiologies of her current illness.

## 2020-07-05 NOTE — Progress Notes (Signed)
PHARMACIST - PHYSICIAN COMMUNICATION  CONCERNING:  Enoxaparin (Lovenox) for DVT Prophylaxis    RECOMMENDATION: Patient was prescribed enoxaprin 40mg  q24 hours for VTE prophylaxis.   Filed Weights   07/04/20 2036  Weight: (!) 138.8 kg (306 lb)    Body mass index is 52.52 kg/m.  Estimated Creatinine Clearance: 121.6 mL/min (by C-G formula based on SCr of 0.84 mg/dL).   Based on [place] patient is candidate for enoxaparin 0.5mg /kg TBW SQ every 24 hours based on BMI being >30.  DESCRIPTION: Pharmacy has adjusted enoxaparin dose per [facility] policy.  Patient is now receiving enoxaparin 0.5 mg/kg every 24 hours   Renda Rolls, PharmD, [facility] 07/05/2020 2:23 AM

## 2020-07-05 NOTE — Progress Notes (Signed)
Pharmacy Antibiotic Note  Christina Sanders is a 40 y.o. female admitted on 07/04/2020 with intraabdominal infection.  Pharmacy has been consulted for Cefepime dosing.  Plan: Ordered Cefepime 2 gm q8h per indication and renal fxn.  Pharmacy will continue to follow SCr and adjust abx dosing if warranted.  Height: 5\' 4"  (162.6 cm) Weight: (!) 138.8 kg (306 lb) IBW/kg (Calculated) : 54.7  Temp (24hrs), Avg:102 F (38.9 C), Min:100.6 F (38.1 C), Max:103.3 F (39.6 C)  Recent Labs  Lab 07/04/20 2049  WBC 15.7*  CREATININE 0.84  LATICACIDVEN 1.9    Estimated Creatinine Clearance: 121.6 mL/min (by C-G formula based on SCr of 0.84 mg/dL).    Allergies  Allergen Reactions  . Heparin Nausea And Vomiting    Antimicrobials this admission: 4/9 Cefepime >>  4/9 Flagyl >>  4/9 Zosyn >> x 1  Microbiology results: 4/8 BCx: Pending  Thank you for allowing pharmacy to be a part of this patient's care.  Renda Rolls, PharmD, [facility] 07/05/2020 3:16 AM

## 2020-07-05 NOTE — Progress Notes (Signed)
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  In the emergency room temperature 103.3.  Blood pressure is stable.  Abdominal CT scan with incarcerated paramedical hernia, no evidence of intestinal obstruction.  Pancreatic calcifications.  Leukocytosis.  Patient seen and examined.  Her sister was at the bedside.  Patient still has mild abdominal pain but no nausea.  No bowel movement for the last 14 hours.  Wants to try some liquids.  SIRS with leukocytosis and tachycardia and tachypnea: Suspect secondary to acute gastroenteritis. -Resuscitated.  Continue maintenance IV fluids today. -Advance to clear liquid, will advance to soft diet if tolerating by evening. -Due to significant symptoms, remains on antibiotics with cefepime and Flagyl, will continue pending clinical improvement or any culture data available. -Surgery aware about periumbilical hernia, do not anticipate any surgery at this time.

## 2020-07-05 NOTE — Plan of Care (Signed)
Continuing with plan of care. 

## 2020-07-05 NOTE — ED Provider Notes (Signed)
Accepted signout of this patient from Dr. Archie Balboa at midnight pending CT.  This is a 40 year old female with a history of non-Hodgkin's lymphoma not on chemotherapy for 2 decades who presents for evaluation of diarrhea, fever and abdominal pain.  Also complaining that she has had a ventral hernia for a very long time but since this afternoon has been hard and nonreducible.  Initial vitals with a temp of 103.3, tachycardic with heart rate of 110.  The ventral hernia is non reducible and very tense and tender to palpation. Labs showing elevated white count of 15.7 with a normal lactic.  CT showed a incarcerated periumbilical hernia containing fat.  The radiologist called me and said that he could not say for sure but felt that it was very unlikely that there was small bowel in it.  I did consult surgery and spoke with Dr. Celine Ahr who looked at the CT and agrees that is just fat-containing hernia.  She recommend admission to the hospitalist service for management of sepsis but no surgical intervention at this time.  I initiated sepsis protocol with Zosyn, IV fluids, and blood cultures.      CRITICAL CARE Performed by: Rudene Re  ?  Total critical care time: 40 min  Critical care time was exclusive of separately billable procedures and treating other patients.  Critical care was necessary to treat or prevent imminent or life-threatening deterioration.  Critical care was time spent personally by me on the following activities: development of treatment plan with patient and/or surrogate as well as nursing, discussions with consultants, evaluation of patient's response to treatment, examination of patient, obtaining history from patient or surrogate, ordering and performing treatments and interventions, ordering and review of laboratory studies, ordering and review of radiographic studies, pulse oximetry and re-evaluation of patient's condition.     I have personally reviewed the  images performed during this visit and I agree with the Radiologist's read.   Interpretation by Radiologist:  CT ABDOMEN PELVIS W CONTRAST  Result Date: 07/05/2020 CLINICAL DATA:  Abdominal pain and fever. EXAM: CT ABDOMEN AND PELVIS WITH CONTRAST TECHNIQUE: Multidetector CT imaging of the abdomen and pelvis was performed using the standard protocol following bolus administration of intravenous contrast. CONTRAST:  130mL OMNIPAQUE IOHEXOL 300 MG/ML  SOLN COMPARISON:  None. FINDINGS: Lower chest: Mild linear atelectasis is seen within the left lung base. Hepatobiliary: Mild diffuse fatty infiltration of the liver parenchyma is noted. No focal liver abnormality is seen. Numerous subcentimeter gallstones are seen within the lumen of an otherwise normal-appearing gallbladder. Pancreas: A 4.4 cm x 3.5 cm coarse calcification is seen extending superiorly from the junction of the pancreatic body and head. An adjacent 1.6 cm x 0.9 cm calcification is also noted (axial CT image 22, CT series number 2). Multiple smaller calcifications are seen adjacent to the posterior and lateral aspects of the pancreatic head. Spleen: Normal in size without focal abnormality. Adrenals/Urinary Tract: Adrenal glands are unremarkable. Kidneys are normal, without obstructing renal calculi or hydronephrosis. A 7 mm diameter cystic appearing area is seen within the mid right kidney. A 2 mm nonobstructing renal stone is seen within the upper pole of the left kidney. A 1.7 cm x 1.1 cm partially calcified soft tissue mass is seen adjacent to the medial aspect of the mid left kidney. The urinary bladder is empty and subsequently limited in evaluation. Stomach/Bowel: Stomach is within normal limits. Appendix appears normal. No evidence of bowel dilatation. Vascular/Lymphatic: No significant vascular findings are present. Subcentimeter para-aortic  lymph nodes are seen. Reproductive: An 11.6 cm x 9.9 cm x 10.1 cm partially calcified uterine fibroid  is seen within the uterine fundus. Additional smaller noncalcified uterine fibroids are noted within the lower uterine segment. The bilateral adnexa are unremarkable. Other: There is a 4.6 cm x 5.5 cm x 5.7 cm left-sided para umbilical hernia. This contains fat and a 4.0 cm x 3.5 cm x 3.8 cm lobulated area of fluid attenuation. A mild-to-moderate amount of inflammatory fat stranding is also seen. No entering or exiting bowel loops are identified. No abdominopelvic ascites. Musculoskeletal: Moderate to marked severity degenerative changes are seen at the level of L5-S1. IMPRESSION: 1. Findings consistent with an incarcerated para umbilical hernia containing fat and a lobulated collection of fluid, as described above. This area of fluid is felt to less likely represent a short segment of incarcerated small bowel, however, this cannot be excluded. Surgical consultation is recommended. 2. Cholelithiasis. 3. Large pancreatic calcification with multiple smaller peripancreatic calcifications which may represent sequelae associated with chronic pancreatitis. Given the size of the larger calcification, MRI correlation is recommended to exclude an underlying neoplasm. 4. Partially calcified soft tissue mass adjacent to the mid left kidney which may represent a partially calcified lymph node. 5. Large calcified and noncalcified uterine fibroids. 6. 2 mm nonobstructing renal stone within the left kidney. 7. Moderate to marked severity degenerative changes at the level of L5-S1. Electronically Signed   By: Virgina Norfolk M.D.   On: 07/05/2020 00:28       Rudene Re, MD 07/05/20 [phone]

## 2020-07-05 NOTE — Consult Note (Signed)
Reason for Consult: Ventral hernia Referring Physician: Alfred Levins, emergency medicine  Christina Sanders is an 40 y.o. female.  HPI: She presented to the ED with abdominal pain and diarrhea that started after eating a salad at lunch.  Multiple episodes of diarrhea, but no emesis.  Evaluation in the ED included a CT scan of the abdomen that shows a moderate-sized umbilical hernia with stranding and fluid.  Patient has a history of Hodgkin's lymphoma with a calcified intra-abdominal mass.  No bowel is contained within the hernia and during a prior ED visit ([facility], 2019), the hernia was also present with a very similar appearance. I do not believe the hernia is a contributing factor to her current presentation.  She was admitted to the hospitalist service and given IV fluids and antibiotics for presumed gastroenteritis.  She states that she still has abdominal discomfort, but it has improved.  Most of her pain is located at the hernia site.  She has not had any nausea or vomiting.  She states that her diarrhea has resolved.  Past Medical History:  Diagnosis Date  . Cancer ([place])   . Non Hodgkin's lymphoma ([place])   . Personal history of chemotherapy     Past Surgical History:  Procedure Laterality Date  . BREAST BIOPSY Left 09/04/2019   US biopsy, venus clip, path pending   . BREAST BIOPSY Left 09/04/2019   Node biopsy, butterfly clip, path pending   . BREAST BIOPSY Left 05/21/2020   [place] bx/ coil clip/ path pending    History reviewed. No pertinent family history.  Social History:  reports that she has never smoked. She has never used smokeless tobacco. She reports that she does not drink alcohol and does not use drugs.  Allergies:  Allergies  Allergen Reactions  . Heparin Nausea And Vomiting    Medications: I have reviewed the patient's current medications.  Results for orders placed or performed during the hospital encounter of 07/04/20 (from the past 48 hour(s))  Lipase, blood     Status:  None   Collection Time: 07/04/20  8:49 PM  Result Value Ref Range   Lipase 34 11 - 51 U/L    Comment: Performed at [facility], [address] [place], [place] [address]  Comprehensive metabolic panel     Status: Abnormal   Collection Time: 07/04/20  8:49 PM  Result Value Ref Range   Sodium 137 135 - 145 mmol/L   Potassium 3.4 (L) 3.5 - 5.1 mmol/L   Chloride 103 98 - 111 mmol/L   CO2 23 22 - 32 mmol/L   Glucose, Bld 111 (H) 70 - 99 mg/dL    Comment: Glucose reference range applies only to samples taken after fasting for at least 8 hours.   BUN 12 6 - 20 mg/dL   Creatinine, Ser 0.84 0.44 - 1.00 mg/dL   Calcium 8.9 8.9 - 10.3 mg/dL   Total Protein 8.8 (H) 6.5 - 8.1 g/dL   Albumin 3.9 3.5 - 5.0 g/dL   AST 20 15 - 41 U/L   ALT 18 0 - 44 U/L   Alkaline Phosphatase 62 38 - 126 U/L   Total Bilirubin 0.7 0.3 - 1.2 mg/dL   GFR, Estimated >60 >60 mL/min    Comment: (NOTE) Calculated using the CKD-EPI Creatinine Equation (2021)    Anion gap 11 5 - 15    Comment: Performed at [facility], [address], [place], [place] [address]  CBC     Status:  Abnormal   Collection Time: 07/04/20  8:49 PM  Result Value Ref Range   WBC 15.7 (H) 4.0 - 10.5 K/uL   RBC 4.73 3.87 - 5.11 MIL/uL   Hemoglobin 12.2 12.0 - 15.0 g/dL   HCT 37.6 36.0 - 46.0 %   MCV 79.5 (L) 80.0 - 100.0 fL   MCH 25.8 (L) 26.0 - 34.0 pg   MCHC 32.4 30.0 - 36.0 g/dL   RDW 15.6 (H) 11.5 - 15.5 %   Platelets 325 150 - 400 K/uL   nRBC 0.0 0.0 - 0.2 %    Comment: Performed at [facility], [address] [place], [place] [address]  hCG, quantitative, pregnancy     Status: None   Collection Time: 07/04/20  8:49 PM  Result Value Ref Range   hCG, Beta Chain, Quant, S 1 <5 mIU/mL    Comment:          GEST. AGE      CONC.  (mIU/mL)   <=1 WEEK        5 - 50     2 WEEKS       50 - 500     3 WEEKS       100 - 10,000     4 WEEKS     1,000 - 30,000     5 WEEKS     3,500 - 115,000   6-8 WEEKS      12,000 - 270,000    12 WEEKS     15,000 - 220,000        FEMALE AND NON-PREGNANT FEMALE:     LESS THAN 5 mIU/mL Performed at [facility], [address] [place], [place] [address]   Lactic acid, plasma     Status: None   Collection Time: 07/04/20  8:49 PM  Result Value Ref Range   Lactic Acid, Venous 1.9 0.5 - 1.9 mmol/L    Comment: Performed at [facility], [address] [place], [place] [address]  Blood culture (routine x 2)     Status: None (Preliminary result)   Collection Time: 07/04/20  8:50 PM   Specimen: Right Antecubital; Blood  Result Value Ref Range   Specimen Description RIGHT ANTECUBITAL    Special Requests      BOTTLES DRAWN AEROBIC AND ANAEROBIC Blood Culture results may not be optimal due to an inadequate volume of blood received in culture bottles   Culture      NO GROWTH < 12 HOURS Performed at [facility], [address] [place], [place] [address]    Report Status PENDING   Blood culture (routine x 2)     Status: None (Preliminary result)   Collection Time: 07/04/20  8:52 PM   Specimen: BLOOD  Result Value Ref Range   Specimen Description BLOOD  RIGHT FOREARM    Special Requests      BOTTLES DRAWN AEROBIC AND ANAEROBIC Blood Culture adequate volume   Culture      NO GROWTH < 12 HOURS Performed at [facility], [address], [place], [place] [address]    Report Status PENDING   Urinalysis, Complete w Microscopic Urine, Clean Catch     Status: Abnormal   Collection Time: 07/05/20 12:12 AM  Result Value Ref Range   Color, Urine YELLOW (A) YELLOW   APPearance CLOUDY (A) CLEAR   Specific Gravity, Urine 1.017 1.005 - 1.030   pH 6.0 5.0 - 8.0   Glucose, UA NEGATIVE NEGATIVE  mg/dL   Hgb urine dipstick MODERATE (A) NEGATIVE   Bilirubin Urine NEGATIVE NEGATIVE   Ketones, ur NEGATIVE NEGATIVE mg/dL   Protein, ur 30 (A) NEGATIVE mg/dL   Nitrite NEGATIVE NEGATIVE   Leukocytes,Ua MODERATE (A) NEGATIVE   RBC / HPF  6-10 0 - 5 RBC/hpf   WBC, UA 11-20 0 - 5 WBC/hpf   Bacteria, UA RARE (A) NONE SEEN   Squamous Epithelial / LPF 11-20 0 - 5   Mucus PRESENT     Comment: Performed at [facility], [address], [place], [place] [address]  Resp Panel by RT-PCR (Flu A&B, Covid) Nasopharyngeal Swab     Status: None   Collection Time: 07/05/20 12:38 AM   Specimen: Nasopharyngeal Swab; Nasopharyngeal(NP) swabs in vial transport medium  Result Value Ref Range   SARS Coronavirus 2 by RT PCR NEGATIVE NEGATIVE    Comment: (NOTE) SARS-CoV-2 target nucleic acids are NOT DETECTED.  The SARS-CoV-2 RNA is generally detectable in upper respiratory specimens during the acute phase of infection. The lowest concentration of SARS-CoV-2 viral copies this assay can detect is 138 copies/mL. A negative result does not preclude SARS-Cov-2 infection and should not be used as the sole basis for treatment or other patient management decisions. A negative result may occur with  improper specimen collection/handling, submission of specimen other than nasopharyngeal swab, presence of viral mutation(s) within the areas targeted by this assay, and inadequate number of viral copies(<138 copies/mL). A negative result must be combined with clinical observations, patient history, and epidemiological information. The expected result is Negative.  Fact Sheet for Patients:  EntrepreneurPulse.com.au  Fact Sheet for Healthcare Providers:  IncredibleEmployment.be  This test is no t yet approved or cleared by the [place] FDA and  has been authorized for detection and/or diagnosis of SARS-CoV-2 by FDA under an Emergency Use Authorization (EUA). This EUA will remain  in effect (meaning this test can be used) for the duration of the COVID-19 declaration under Section 564(b)(1) of the Act, 21 U.S.C.section 360bbb-3(b)(1), unless the authorization is terminated  or revoked sooner.        Influenza A by PCR NEGATIVE NEGATIVE   Influenza B by PCR NEGATIVE NEGATIVE    Comment: (NOTE) The Xpert Xpress SARS-CoV-2/FLU/RSV plus assay is intended as an aid in the diagnosis of influenza from Nasopharyngeal swab specimens and should not be used as a sole basis for treatment. Nasal washings and aspirates are unacceptable for Xpert Xpress SARS-CoV-2/FLU/RSV testing.  Fact Sheet for Patients: EntrepreneurPulse.com.au  Fact Sheet for Healthcare Providers: IncredibleEmployment.be  This test is not yet approved or cleared by the [place] FDA and has been authorized for detection and/or diagnosis of SARS-CoV-2 by FDA under an Emergency Use Authorization (EUA). This EUA will remain in effect (meaning this test can be used) for the duration of the COVID-19 declaration under Section 564(b)(1) of the Act, 21 U.S.C. section 360bbb-3(b)(1), unless the authorization is terminated or revoked.  Performed at [facility], [place], [place] [address]   Procalcitonin - Baseline     Status: None   Collection Time: 07/05/20  1:00 AM  Result Value Ref Range   Procalcitonin 2.62 ng/mL    Comment:        Interpretation: PCT > 2 ng/mL: Systemic infection (sepsis) is likely, unless other causes are known. (NOTE)       Sepsis PCT Algorithm           Lower Respiratory Tract  Infection PCT Algorithm    ----------------------------     ----------------------------         PCT < 0.25 ng/mL                PCT < 0.10 ng/mL          Strongly encourage             Strongly discourage   discontinuation of antibiotics    initiation of antibiotics    ----------------------------     -----------------------------       PCT 0.25 - 0.50 ng/mL            PCT 0.10 - 0.25 ng/mL               OR       >80% decrease in PCT            Discourage initiation of                                             antibiotics      Encourage discontinuation           of antibiotics    ----------------------------     -----------------------------         PCT >= 0.50 ng/mL              PCT 0.26 - 0.50 ng/mL               AND       <80% decrease in PCT              Encourage initiation of                                             antibiotics       Encourage continuation           of antibiotics    ----------------------------     -----------------------------        PCT >= 0.50 ng/mL                  PCT > 0.50 ng/mL               AND         increase in PCT                  Strongly encourage                                      initiation of antibiotics    Strongly encourage escalation           of antibiotics                                     -----------------------------                                           PCT <= 0.25 ng/mL  OR                                        > 80% decrease in PCT                                      Discontinue / Do not initiate                                             antibiotics  Performed at [facility], [address] [place], [place] [address]   HIV Antibody (routine testing w rflx)     Status: None   Collection Time: 07/05/20  2:10 AM  Result Value Ref Range   HIV Screen 4th Generation wRfx Non Reactive Non Reactive    Comment: Performed at [facility], [place], [place], [place] [address]  CBC with Differential     Status: Abnormal   Collection Time: 07/05/20  2:10 AM  Result Value Ref Range   WBC 18.6 (H) 4.0 - 10.5 K/uL   RBC 4.18 3.87 - 5.11 MIL/uL   Hemoglobin 10.9 (L) 12.0 - 15.0 g/dL   HCT 33.3 (L) 36.0 - 46.0 %   MCV 79.7 (L) 80.0 - 100.0 fL   MCH 26.1 26.0 - 34.0 pg   MCHC 32.7 30.0 - 36.0 g/dL   RDW 15.7 (H) 11.5 - 15.5 %   Platelets 263 150 - 400 K/uL   nRBC 0.0 0.0 - 0.2 %   Neutrophils Relative % 94 %   Neutro Abs 17.5 (H) 1.7 - 7.7 K/uL   Lymphocytes  Relative 3 %   Lymphs Abs 0.5 (L) 0.7 - 4.0 K/uL   Monocytes Relative 2 %   Monocytes Absolute 0.4 0.1 - 1.0 K/uL   Eosinophils Relative 0 %   Eosinophils Absolute 0.0 0.0 - 0.5 K/uL   Basophils Relative 0 %   Basophils Absolute 0.0 0.0 - 0.1 K/uL   Immature Granulocytes 1 %   Abs Immature Granulocytes 0.15 (H) 0.00 - 0.07 K/uL    Comment: Performed at [facility], [address] [place], [place] [address]  Comprehensive metabolic panel     Status: Abnormal   Collection Time: 07/05/20  2:10 AM  Result Value Ref Range   Sodium 136 135 - 145 mmol/L   Potassium 3.3 (L) 3.5 - 5.1 mmol/L   Chloride 103 98 - 111 mmol/L   CO2 24 22 - 32 mmol/L   Glucose, Bld 111 (H) 70 - 99 mg/dL    Comment: Glucose reference range applies only to samples taken after fasting for at least 8 hours.   BUN 9 6 - 20 mg/dL   Creatinine, Ser 0.85 0.44 - 1.00 mg/dL   Calcium 8.2 (L) 8.9 - 10.3 mg/dL   Total Protein 8.0 6.5 - 8.1 g/dL   Albumin 3.4 (L) 3.5 - 5.0 g/dL   AST 19 15 - 41 U/L   ALT 17 0 - 44 U/L   Alkaline Phosphatase 53 38 - 126 U/L   Total Bilirubin 0.7 0.3 - 1.2 mg/dL   GFR, Estimated >60 >60 mL/min    Comment: (NOTE) Calculated using the CKD-EPI Creatinine  Equation (2021)    Anion gap 9 5 - 15    Comment: Performed at [facility], [address] [place], [place] [address]  Lactic acid, plasma     Status: None   Collection Time: 07/05/20  2:10 AM  Result Value Ref Range   Lactic Acid, Venous 1.8 0.5 - 1.9 mmol/L    Comment: Performed at [facility], [address] [place], [place] [address]  Protime-INR     Status: Abnormal   Collection Time: 07/05/20  2:10 AM  Result Value Ref Range   Prothrombin Time 16.5 (H) 11.4 - 15.2 seconds   INR 1.4 (H) 0.8 - 1.2    Comment: (NOTE) INR goal varies based on device and disease states. Performed at [facility], [address] [place], [place] [address]   APTT     Status: None   Collection Time:  07/05/20  2:10 AM  Result Value Ref Range   aPTT 25 24 - 36 seconds    Comment: Performed at [facility], [address] [place], [place] [address]  Lactic acid, plasma     Status: None   Collection Time: 07/05/20  8:03 AM  Result Value Ref Range   Lactic Acid, Venous 1.0 0.5 - 1.9 mmol/L    Comment: Performed at [facility], [address], [place], [place] [address]    CT ABDOMEN PELVIS W CONTRAST  Result Date: 07/05/2020 CLINICAL DATA:  Abdominal pain and fever. EXAM: CT ABDOMEN AND PELVIS WITH CONTRAST TECHNIQUE: Multidetector CT imaging of the abdomen and pelvis was performed using the standard protocol following bolus administration of intravenous contrast. CONTRAST:  114mL OMNIPAQUE IOHEXOL 300 MG/ML  SOLN COMPARISON:  None. FINDINGS: Lower chest: Mild linear atelectasis is seen within the left lung base. Hepatobiliary: Mild diffuse fatty infiltration of the liver parenchyma is noted. No focal liver abnormality is seen. Numerous subcentimeter gallstones are seen within the lumen of an otherwise normal-appearing gallbladder. Pancreas: A 4.4 cm x 3.5 cm coarse calcification is seen extending superiorly from the junction of the pancreatic body and head. An adjacent 1.6 cm x 0.9 cm calcification is also noted (axial CT image 22, CT series number 2). Multiple smaller calcifications are seen adjacent to the posterior and lateral aspects of the pancreatic head. Spleen: Normal in size without focal abnormality. Adrenals/Urinary Tract: Adrenal glands are unremarkable. Kidneys are normal, without obstructing renal calculi or hydronephrosis. A 7 mm diameter cystic appearing area is seen within the mid right kidney. A 2 mm nonobstructing renal stone is seen within the upper pole of the left kidney. A 1.7 cm x 1.1 cm partially calcified soft tissue mass is seen adjacent to the medial aspect of the mid left kidney. The urinary bladder is empty and subsequently limited in evaluation.  Stomach/Bowel: Stomach is within normal limits. Appendix appears normal. No evidence of bowel dilatation. Vascular/Lymphatic: No significant vascular findings are present. Subcentimeter para-aortic lymph nodes are seen. Reproductive: An 11.6 cm x 9.9 cm x 10.1 cm partially calcified uterine fibroid is seen within the uterine fundus. Additional smaller noncalcified uterine fibroids are noted within the lower uterine segment. The bilateral adnexa are unremarkable. Other: There is a 4.6 cm x 5.5 cm x 5.7 cm left-sided para umbilical hernia. This contains fat and a 4.0 cm x 3.5 cm x 3.8 cm lobulated area of fluid attenuation. A mild-to-moderate amount of inflammatory fat stranding is also seen. No entering or exiting bowel loops are identified. No abdominopelvic  ascites. Musculoskeletal: Moderate to marked severity degenerative changes are seen at the level of L5-S1. IMPRESSION: 1. Findings consistent with an incarcerated para umbilical hernia containing fat and a lobulated collection of fluid, as described above. This area of fluid is felt to less likely represent a short segment of incarcerated small bowel, however, this cannot be excluded. Surgical consultation is recommended. 2. Cholelithiasis. 3. Large pancreatic calcification with multiple smaller peripancreatic calcifications which may represent sequelae associated with chronic pancreatitis. Given the size of the larger calcification, MRI correlation is recommended to exclude an underlying neoplasm. 4. Partially calcified soft tissue mass adjacent to the mid left kidney which may represent a partially calcified lymph node. 5. Large calcified and noncalcified uterine fibroids. 6. 2 mm nonobstructing renal stone within the left kidney. 7. Moderate to marked severity degenerative changes at the level of L5-S1. Electronically Signed   By: Virgina Norfolk M.D.   On: 07/05/2020 00:28    Review of Systems  All other systems reviewed and are negative.  Blood  pressure (!) 157/83, pulse 99, temperature (!) 102.3 F (39.1 C), temperature source Oral, resp. rate 16, height 5\' 4"  (1.626 m), weight (!) 142.3 kg, SpO2 100 %, unknown if currently breastfeeding. Physical Exam Constitutional:      Appearance: She is well-developed. She is obese.     Comments: Appears uncomfortable.  HENT:     Head: Normocephalic and atraumatic.     Nose: Nose normal.     Mouth/Throat:     Mouth: Mucous membranes are moist.  Eyes:     General: No scleral icterus.       Right eye: No discharge.        Left eye: No discharge.     Conjunctiva/sclera: Conjunctivae normal.  Cardiovascular:     Rate and Rhythm: Normal rate and regular rhythm.  Pulmonary:     Effort: Pulmonary effort is normal. No respiratory distress.  Abdominal:     Palpations: Abdomen is soft.     Hernia: A hernia is present.     Comments: Protuberant, consistent with her level of obesity.  She has a vertical midline incision and just to the left, at the umbilicus, there is an obvious hernia.  It is firm and quite tender.  I am unable to reduce it secondary to patient discomfort.  The patient states that in the past, it has always been soft and easily reduced.  Genitourinary:    Comments: Deferred Musculoskeletal:        General: No deformity or signs of injury.  Skin:    General: Skin is warm and dry.  Neurological:     General: No focal deficit present.     Mental Status: She is alert and oriented to person, place, and time.  Psychiatric:        Mood and Affect: Mood normal.        Behavior: Behavior normal.     Assessment/Plan: This is a 40 year old woman admitted with sepsis, likely secondary to gastroenteritis or food poisoning.  She is being resuscitated by the medicine service.  She had lost her IV and it has now been replaced.  IV fluid resuscitation will continue.  She is on IV antibiotics.  She does have a persistent leukocytosis today.  I still do not think the hernia is the etiology  of her presentation, but it is incarcerated and causing her significant pain.  This is a change from prior, when it has been soft and reducible, despite the similar  appearance on imaging.  I have offered her surgical repair.  I discussed the risks of the procedure with her and her sister, who is at bedside.  My biggest concern is recurrence, given her morbid obesity and BMI of 53.85.  There is also the risk of damage to surrounding tissues or structures, including bowel, blood vessels, and other intra-abdominal organs.  There are also wound healing risks as well as mesh migration or infection.  If her leukocytosis has not improved by tomorrow, I will likely use an absorbable mesh, rather than a permanent prosthesis, to try and avoid the risk of mesh infection.  She and her sister had the opportunity to ask questions and these were answered to their satisfaction. Fredirick Maudlin 07/05/2020, 2:21 PM

## 2020-07-05 NOTE — Anesthesia Preprocedure Evaluation (Addendum)
Anesthesia Evaluation  Patient identified by MRN, date of birth, ID band Patient awake  General Assessment Comment:Incarcerated umbilical hernia. Hx morbid obesity, prior hodgkin's lymphoma s/p chemo. Prior abdominal surgery (c-section)  Reviewed: Allergy & Precautions, H&P , NPO status , Patient's Chart, lab work & pertinent test results  History of Anesthesia Complications Negative for: history of anesthetic complications  Airway Mallampati: II  TM Distance: >3 FB Neck ROM: full    Dental  (+) Chipped   Pulmonary neg pulmonary ROS, neg sleep apnea, neg COPD, Patient abstained from smoking.Not current smoker,    Pulmonary exam normal        Cardiovascular Exercise Tolerance: Good METS(-) hypertension(-) CAD and (-) Past MI negative cardio ROS Normal cardiovascular exam(-) dysrhythmias      Neuro/Psych negative neurological ROS  negative psych ROS   GI/Hepatic negative GI ROS, Neg liver ROS, neg GERD  ,(+)     (-) substance abuse  ,   Endo/Other  negative endocrine ROSneg diabetesMorbid obesity  Renal/GU negative Renal ROS     Musculoskeletal   Abdominal   Peds  Hematology negative hematology ROS (+)   Anesthesia Other Findings Past Medical History: No date: Cancer ([place]) No date: Non Hodgkin's lymphoma ([place]) No date: Personal history of chemotherapy  Past Surgical History: 09/04/2019: BREAST BIOPSY; Left     Comment:  US biopsy, venus clip, path pending  09/04/2019: BREAST BIOPSY; Left     Comment:  Node biopsy, butterfly clip, path pending  05/21/2020: BREAST BIOPSY; Left     Comment:  [place] bx/ coil clip/ path pending  BMI    Body Mass Index: 53.85 kg/m      Reproductive/Obstetrics negative OB ROS                           Anesthesia Physical Anesthesia Plan  ASA: III  Anesthesia Plan: General ETT   Post-op Pain Management:    Induction: Intravenous  PONV Risk Score  and Plan: Ondansetron, Dexamethasone, Midazolam and Treatment may vary due to age or medical condition  Airway Management Planned: Oral ETT  Additional Equipment:   Intra-op Plan:   Post-operative Plan: Extubation in OR  Informed Consent: I have reviewed the patients History and Physical, chart, labs and discussed the procedure including the risks, benefits and alternatives for the proposed anesthesia with the patient or authorized representative who has indicated his/her understanding and acceptance.     Dental Advisory Given  Plan Discussed with: Anesthesiologist, CRNA and Surgeon  Anesthesia Plan Comments: (Patient consented for risks of anesthesia including but not limited to:  - adverse reactions to medications - damage to eyes, teeth, lips or other oral mucosa - nerve damage due to positioning  - sore throat or hoarseness - Damage to heart, brain, nerves, lungs, other parts of body or loss of life  Patient voiced understanding.)        Anesthesia Quick Evaluation

## 2020-07-05 NOTE — Sepsis Progress Note (Signed)
Following for Code Sepsis  

## 2020-07-05 NOTE — Progress Notes (Signed)
CODE SEPSIS - PHARMACY COMMUNICATION  **Broad Spectrum Antibiotics should be administered within 1 hour of Sepsis diagnosis**  Time Code Sepsis Called/Page Received: 0147  Antibiotics Ordered: Zosyn  Time of 1st antibiotic administration: 0117  Renda Rolls, PharmD, [facility] 07/05/2020 1:48 AM

## 2020-07-05 NOTE — H&P (Addendum)
PATIENT NAME: Christina Sanders    MR#:  [id number]  DATE OF BIRTH:  Sep 01, 1980  DATE OF ADMISSION:  07/04/2020  PRIMARY CARE PHYSICIAN: Janie Morning, DO   Patient is coming from: Home  REQUESTING/REFERRING PHYSICIAN: Rudene Re, MD CHIEF COMPLAINT:   Chief Complaint  Patient presents with  . Fever  . Abdominal Pain    With diarrhea     HISTORY OF PRESENT ILLNESS:  Christina Sanders is a 40 y.o. African-American female with medical history significant for non-Hodgkin lymphoma in remission for 20years, presented to the ER with acute onset of lower abdominal pain in the shoulder "she will big p.m. "squadron 6 with associated diarrhea with loose bowel movements after eating yesterday.  She has been having generalized weakness and fever that was up to 103 with chills, mild palpitations with associated tachycardia.  No cough or dyspnea or wheezing.  No dysuria, oliguria or hematuria, urgency or frequency or flank pain.  She denies any rhinorrhea or nasal congestion or sore throat. ED Course: Upon presentation to the ER, temperature was 103.3, blood pressure was 142/72, pulse 110, respiratory rate was normal later 23 with otherwise normal vital signs. Labs revealed mild hypokalemia 3.4 leukocytosis of 15.7.  Urinalysis showed 11-20 WBCs and 11-20 squamous cells with 6-10 RBCs and positive mucus. Imaging: Abdominal pelvic CT scan revealed the following: 1. Findings consistent with an incarcerated para umbilical hernia containing fat and a lobulated collection of fluid, as described above. This area of fluid is felt to less likely represent a short segment of incarcerated small bowel, however, this cannot be excluded. Surgical consultation is recommended. 2. Cholelithiasis. 3. Large pancreatic calcification with multiple smaller peripancreatic calcifications which may represent sequelae associated with chronic pancreatitis. Given the size of the  larger calcification, MRI correlation is recommended to exclude an underlying neoplasm. 4. Partially calcified soft tissue mass adjacent to the mid left kidney which may represent a partially calcified lymph node. 5. Large calcified and noncalcified uterine fibroids. 6. 2 mm nonobstructing renal stone within the left kidney. 7. Moderate to marked severity degenerative changes at the level of L5-S1.  The patient was given p.o. Tylenol, IV Zosyn, IV morphine sulfate and Zofran, hydration with 1 L bolus of IV lactated Ringer, 1 L of IV normal saline followed by lactated Ringer at 150 mL/h.  She will be admitted to a medical monitored bed for further evaluation and management. PAST MEDICAL HISTORY:   Past Medical History:  Diagnosis Date  . Cancer ([place])   . Non Hodgkin's lymphoma ([place])   . Personal history of chemotherapy     PAST SURGICAL HISTORY:   Past Surgical History:  Procedure Laterality Date  . BREAST BIOPSY Left 09/04/2019   US biopsy, venus clip, path pending   . BREAST BIOPSY Left 09/04/2019   Node biopsy, butterfly clip, path pending   . BREAST BIOPSY Left 05/21/2020   [place] bx/ coil clip/ path pending    SOCIAL HISTORY:   Social History   Tobacco Use  . Smoking status: Never Smoker  . Smokeless tobacco: Never Used  Substance Use Topics  . Alcohol use: No    FAMILY HISTORY:  History reviewed. No pertinent family history.  She denied any familial diseases.  DRUG ALLERGIES:   Allergies  Allergen Reactions  . Heparin Nausea And Vomiting    REVIEW OF SYSTEMS:   ROS As per history of present illness. All pertinent systems were reviewed above. Constitutional,  HEENT, cardiovascular, respiratory, GI, GU, musculoskeletal, neuro, psychiatric, endocrine, integumentary and hematologic systems were reviewed and are otherwise negative/unremarkable except for positive findings mentioned above in the HPI.   MEDICATIONS AT HOME:   Prior to Admission medications    Medication Sig Start Date End Date Taking? Authorizing Provider  naproxen (NAPROSYN) 500 MG tablet Take 1 tablet (500 mg total) by mouth 2 (two) times daily with a meal. Patient not taking: Reported on 07/05/2020 07/26/15   Sable Feil, PA-C  traMADol (ULTRAM) 50 MG tablet Take 1 tablet (50 mg total) by mouth every 6 (six) hours as needed for moderate pain. Patient not taking: Reported on 07/05/2020 07/26/15   Sable Feil, PA-C      VITAL SIGNS:  Blood pressure 131/66, pulse 98, temperature (!) 100.6 F (38.1 C), temperature source Oral, resp. rate 20, height 5\' 4"  (1.626 m), weight (!) 138.8 kg, SpO2 98 %, unknown if currently breastfeeding.  PHYSICAL EXAMINATION:  Physical Exam  GENERAL:  40 y.o.-year-old African-American female patient lying in the bed with no acute distress.  EYES: Pupils equal, round, reactive to light and accommodation. No scleral icterus. Extraocular muscles intact.  HEENT: Head atraumatic, normocephalic. Oropharynx and nasopharynx clear.  NECK:  Supple, no jugular venous distention. No thyroid enlargement, no tenderness.  LUNGS: Normal breath sounds bilaterally, no wheezing, rales,rhonchi or crepitation. No use of accessory muscles of respiration.  CARDIOVASCULAR: Regular rate and rhythm, S1, S2 normal. No murmurs, rubs, or gallops.  ABDOMEN: Soft, with suprapubic and left lower quadrant tenderness and palpable and irreducible left lower quadrant ventral hernia, secondary to tenderness.  Positive bowel sounds with no other organomegaly. EXTREMITIES: No pedal edema, cyanosis, or clubbing.  NEUROLOGIC: Cranial nerves II through XII are intact. Muscle strength 5/5 in all extremities. Sensation intact. Gait not checked.  PSYCHIATRIC: The patient is alert and oriented x 3.  Normal affect and good eye contact. SKIN: No obvious rash, lesion, or ulcer.   LABORATORY PANEL:   CBC Recent Labs  Lab 07/04/20 2049  WBC 15.7*  HGB 12.2  HCT 37.6  PLT 325    ------------------------------------------------------------------------------------------------------------------  Chemistries  Recent Labs  Lab 07/04/20 2049  NA 137  K 3.4*  CL 103  CO2 23  GLUCOSE 111*  BUN 12  CREATININE 0.84  CALCIUM 8.9  AST 20  ALT 18  ALKPHOS 62  BILITOT 0.7   ------------------------------------------------------------------------------------------------------------------  Cardiac Enzymes No results for input(s): TROPONINI in the last 168 hours. ------------------------------------------------------------------------------------------------------------------  RADIOLOGY:  CT ABDOMEN PELVIS W CONTRAST  Result Date: 07/05/2020 CLINICAL DATA:  Abdominal pain and fever. EXAM: CT ABDOMEN AND PELVIS WITH CONTRAST TECHNIQUE: Multidetector CT imaging of the abdomen and pelvis was performed using the standard protocol following bolus administration of intravenous contrast. CONTRAST:  142mL OMNIPAQUE IOHEXOL 300 MG/ML  SOLN COMPARISON:  None. FINDINGS: Lower chest: Mild linear atelectasis is seen within the left lung base. Hepatobiliary: Mild diffuse fatty infiltration of the liver parenchyma is noted. No focal liver abnormality is seen. Numerous subcentimeter gallstones are seen within the lumen of an otherwise normal-appearing gallbladder. Pancreas: A 4.4 cm x 3.5 cm coarse calcification is seen extending superiorly from the junction of the pancreatic body and head. An adjacent 1.6 cm x 0.9 cm calcification is also noted (axial CT image 22, CT series number 2). Multiple smaller calcifications are seen adjacent to the posterior and lateral aspects of the pancreatic head. Spleen: Normal in size without focal abnormality. Adrenals/Urinary Tract: Adrenal glands are unremarkable. Kidneys are  normal, without obstructing renal calculi or hydronephrosis. A 7 mm diameter cystic appearing area is seen within the mid right kidney. A 2 mm nonobstructing renal stone is seen within  the upper pole of the left kidney. A 1.7 cm x 1.1 cm partially calcified soft tissue mass is seen adjacent to the medial aspect of the mid left kidney. The urinary bladder is empty and subsequently limited in evaluation. Stomach/Bowel: Stomach is within normal limits. Appendix appears normal. No evidence of bowel dilatation. Vascular/Lymphatic: No significant vascular findings are present. Subcentimeter para-aortic lymph nodes are seen. Reproductive: An 11.6 cm x 9.9 cm x 10.1 cm partially calcified uterine fibroid is seen within the uterine fundus. Additional smaller noncalcified uterine fibroids are noted within the lower uterine segment. The bilateral adnexa are unremarkable. Other: There is a 4.6 cm x 5.5 cm x 5.7 cm left-sided para umbilical hernia. This contains fat and a 4.0 cm x 3.5 cm x 3.8 cm lobulated area of fluid attenuation. A mild-to-moderate amount of inflammatory fat stranding is also seen. No entering or exiting bowel loops are identified. No abdominopelvic ascites. Musculoskeletal: Moderate to marked severity degenerative changes are seen at the level of L5-S1. IMPRESSION: 1. Findings consistent with an incarcerated para umbilical hernia containing fat and a lobulated collection of fluid, as described above. This area of fluid is felt to less likely represent a short segment of incarcerated small bowel, however, this cannot be excluded. Surgical consultation is recommended. 2. Cholelithiasis. 3. Large pancreatic calcification with multiple smaller peripancreatic calcifications which may represent sequelae associated with chronic pancreatitis. Given the size of the larger calcification, MRI correlation is recommended to exclude an underlying neoplasm. 4. Partially calcified soft tissue mass adjacent to the mid left kidney which may represent a partially calcified lymph node. 5. Large calcified and noncalcified uterine fibroids. 6. 2 mm nonobstructing renal stone within the left kidney. 7. Moderate  to marked severity degenerative changes at the level of L5-S1. Electronically Signed   By: Virgina Norfolk M.D.   On: 07/05/2020 00:28      IMPRESSION AND PLAN:  Active Problems:   Sepsis ([place])  1.  Systemic inflammatory response syndrome, concerning for associated sepsis based on leukocytosis, tachycardia and tachypnea given diarrhea and the possibility of acute enteritis.  The patient has pyuria that could be related to acid UTI contributing to her sepsis. -The patient will be admitted to a medical monitored bed. -We will keep her n.p.o. -Pain management will be provided. -We will continue antibiotic therapy with IV cefepime and Flagyl. -We will follow blood and urine cultures. -The patient will be hydrated with IV normal saline.  2.  Incarcerated periumbilical fat and fluid containing hernia. -Pain management will be provided. -General surgery consult to be obtained. -Dr. Celine Ahr is aware about the patient.  3.  Non-Hodgkin lymphoma in remission.  DVT prophylaxis: Lovenox. Code Status: full code. Family Communication:  The plan of care was discussed in details with the patient (and family available at bedside). I answered all questions. The patient agreed to proceed with the above mentioned plan. Further management will depend upon hospital course. Disposition Plan: Back to previous home environment Consults called: General surgery consult All the records are reviewed and case discussed with ED provider.  Status is: Inpatient  Remains inpatient appropriate because:Ongoing active pain requiring inpatient pain management, Ongoing diagnostic testing needed not appropriate for outpatient work up, Unsafe d/c plan, IV treatments appropriate due to intensity of illness or inability to take PO and  Inpatient level of care appropriate due to severity of illness   Dispo: The patient is from: Home              Anticipated d/c is to: Home              Patient currently is not medically  stable to d/c.   Difficult to place patient No   TOTAL TIME TAKING CARE OF THIS PATIENT: 55 minutes.    Christel Mormon M.D on 07/05/2020 at 2:20 AM  [facility]   From 7 PM-7 AM, contact night-coverage [email or web address]  CC: Primary care physician; Janie Morning, DO

## 2020-07-06 ENCOUNTER — Inpatient Hospital Stay: Payer: BC Managed Care – PPO | Admitting: Anesthesiology

## 2020-07-06 ENCOUNTER — Encounter: Payer: Self-pay | Admitting: Family Medicine

## 2020-07-06 ENCOUNTER — Encounter
Admission: EM | Disposition: A | Discharge status: Home / Self Care | Payer: Self-pay | Source: Home / Self Care | Attending: Internal Medicine

## 2020-07-06 DIAGNOSIS — Z5331 Laparoscopic surgical procedure converted to open procedure: Secondary | ICD-10-CM

## 2020-07-06 DIAGNOSIS — K660 Peritoneal adhesions (postprocedural) (postinfection): Secondary | ICD-10-CM

## 2020-07-06 DIAGNOSIS — Z6841 Body Mass Index (BMI) 40.0 and over, adult: Secondary | ICD-10-CM

## 2020-07-06 DIAGNOSIS — R198 Other specified symptoms and signs involving the digestive system and abdomen: Secondary | ICD-10-CM

## 2020-07-06 DIAGNOSIS — K436 Other and unspecified ventral hernia with obstruction, without gangrene: Secondary | ICD-10-CM

## 2020-07-06 DIAGNOSIS — E669 Obesity, unspecified: Secondary | ICD-10-CM

## 2020-07-06 HISTORY — PX: UMBILICAL HERNIA REPAIR: SHX196

## 2020-07-06 LAB — CBC WITH DIFFERENTIAL/PLATELET
Abs Immature Granulocytes: 0.07 10*3/uL (ref 0.00–0.07)
Basophils Absolute: 0 10*3/uL (ref 0.0–0.1)
Basophils Relative: 0 %
Eosinophils Absolute: 0 10*3/uL (ref 0.0–0.5)
Eosinophils Relative: 0 %
HCT: 31.8 % — ABNORMAL LOW (ref 36.0–46.0)
Hemoglobin: 10.3 g/dL — ABNORMAL LOW (ref 12.0–15.0)
Immature Granulocytes: 1 %
Lymphocytes Relative: 4 %
Lymphs Abs: 0.6 10*3/uL — ABNORMAL LOW (ref 0.7–4.0)
MCH: 25.9 pg — ABNORMAL LOW (ref 26.0–34.0)
MCHC: 32.4 g/dL (ref 30.0–36.0)
MCV: 79.9 fL — ABNORMAL LOW (ref 80.0–100.0)
Monocytes Absolute: 0.5 10*3/uL (ref 0.1–1.0)
Monocytes Relative: 4 %
Neutro Abs: 11.4 10*3/uL — ABNORMAL HIGH (ref 1.7–7.7)
Neutrophils Relative %: 91 %
Platelets: 234 10*3/uL (ref 150–400)
RBC: 3.98 MIL/uL (ref 3.87–5.11)
RDW: 15.9 % — ABNORMAL HIGH (ref 11.5–15.5)
WBC: 12.6 10*3/uL — ABNORMAL HIGH (ref 4.0–10.5)
nRBC: 0 % (ref 0.0–0.2)

## 2020-07-06 LAB — BASIC METABOLIC PANEL
Anion gap: 10 (ref 5–15)
BUN: 9 mg/dL (ref 6–20)
CO2: 18 mmol/L — ABNORMAL LOW (ref 22–32)
Calcium: 7.9 mg/dL — ABNORMAL LOW (ref 8.9–10.3)
Chloride: 108 mmol/L (ref 98–111)
Creatinine, Ser: 0.83 mg/dL (ref 0.44–1.00)
GFR, Estimated: >60 mL/min (ref >60)
Glucose, Bld: 121 mg/dL — ABNORMAL HIGH (ref 70–99)
Potassium: 3.4 mmol/L — ABNORMAL LOW (ref 3.5–5.1)
Sodium: 136 mmol/L (ref 135–145)

## 2020-07-06 LAB — PHOSPHORUS: Phosphorus: <1 mg/dL — CL (ref 2.5–4.6)

## 2020-07-06 LAB — MAGNESIUM: Magnesium: 1.7 mg/dL (ref 1.7–2.4)

## 2020-07-06 LAB — LACTIC ACID, PLASMA
Lactic Acid, Venous: 1.2 mmol/L (ref 0.5–1.9)
Lactic Acid, Venous: 1.3 mmol/L (ref 0.5–1.9)

## 2020-07-06 SURGERY — REPAIR, HERNIA, UMBILICAL, ADULT
Anesthesia: General

## 2020-07-06 MED ORDER — OXYCODONE HCL 5 MG PO TABS: 5.0000 mg | ORAL_TABLET | Freq: Once | ORAL | Status: DC | PRN

## 2020-07-06 MED ORDER — DEXAMETHASONE SODIUM PHOSPHATE 10 MG/ML IJ SOLN
INTRAMUSCULAR | Status: AC
  Filled 2020-07-06: qty 1

## 2020-07-06 MED ORDER — ONDANSETRON HCL 4 MG/2ML IJ SOLN
INTRAMUSCULAR | Status: AC
  Filled 2020-07-06: qty 2

## 2020-07-06 MED ORDER — LIDOCAINE HCL (PF) 2 % IJ SOLN
INTRAMUSCULAR | Status: AC
  Filled 2020-07-06: qty 5

## 2020-07-06 MED ORDER — ONDANSETRON HCL 4 MG/2ML IJ SOLN
INTRAMUSCULAR | Status: DC | PRN
  Administered 2020-07-06: 4 mg via INTRAVENOUS

## 2020-07-06 MED ORDER — ROCURONIUM BROMIDE 100 MG/10ML IV SOLN
INTRAVENOUS | Status: DC | PRN
  Administered 2020-07-06: 20 mg via INTRAVENOUS
  Administered 2020-07-06: 50 mg via INTRAVENOUS

## 2020-07-06 MED ORDER — LIDOCAINE HCL (CARDIAC) PF 100 MG/5ML IV SOSY
PREFILLED_SYRINGE | INTRAVENOUS | Status: DC | PRN
  Administered 2020-07-06: 100 mg via INTRAVENOUS

## 2020-07-06 MED ORDER — BUPIVACAINE LIPOSOME 1.3 % IJ SUSP
INTRAMUSCULAR | Status: DC | PRN
  Administered 2020-07-06: 20 mL

## 2020-07-06 MED ORDER — SUCCINYLCHOLINE CHLORIDE 200 MG/10ML IV SOSY
PREFILLED_SYRINGE | INTRAVENOUS | Status: AC
  Filled 2020-07-06: qty 10

## 2020-07-06 MED ORDER — BUPIVACAINE HCL 0.25 % IJ SOLN
INTRAMUSCULAR | Status: DC | PRN
  Administered 2020-07-06: 15 mL

## 2020-07-06 MED ORDER — LIDOCAINE-EPINEPHRINE 1 %-1:100000 IJ SOLN
INTRAMUSCULAR | Status: DC | PRN
  Administered 2020-07-06: 15 mL

## 2020-07-06 MED ORDER — ROCURONIUM BROMIDE 10 MG/ML (PF) SYRINGE
PREFILLED_SYRINGE | INTRAVENOUS | Status: AC
  Filled 2020-07-06: qty 10

## 2020-07-06 MED ORDER — OXYCODONE HCL 5 MG/5ML PO SOLN: 5.0000 mg | Freq: Once | ORAL | Status: DC | PRN

## 2020-07-06 MED ORDER — FENTANYL CITRATE (PF) 100 MCG/2ML IJ SOLN: 25.0000 ug | INTRAMUSCULAR | Status: DC | PRN

## 2020-07-06 MED ORDER — POTASSIUM PHOSPHATES 15 MMOLE/5ML IV SOLN
30.0000 mmol | Freq: Once | INTRAVENOUS | Status: AC
  Administered 2020-07-06: 30 mmol via INTRAVENOUS
  Filled 2020-07-06: qty 10

## 2020-07-06 MED ORDER — CEFAZOLIN SODIUM 1 G IJ SOLR
INTRAMUSCULAR | Status: AC
  Filled 2020-07-06: qty 30

## 2020-07-06 MED ORDER — FENTANYL CITRATE (PF) 100 MCG/2ML IJ SOLN
INTRAMUSCULAR | Status: DC | PRN
  Administered 2020-07-06 (×2): 50 ug via INTRAVENOUS

## 2020-07-06 MED ORDER — SUGAMMADEX SODIUM 500 MG/5ML IV SOLN
INTRAVENOUS | Status: AC
  Filled 2020-07-06: qty 5

## 2020-07-06 MED ORDER — DEXTROSE 5 % IV SOLN
INTRAVENOUS | Status: DC | PRN
  Administered 2020-07-06: 3 g via INTRAVENOUS

## 2020-07-06 MED ORDER — SUGAMMADEX SODIUM 500 MG/5ML IV SOLN
INTRAVENOUS | Status: DC | PRN
  Administered 2020-07-06: 300 mg via INTRAVENOUS

## 2020-07-06 MED ORDER — MIDAZOLAM HCL 2 MG/2ML IJ SOLN
INTRAMUSCULAR | Status: AC
  Filled 2020-07-06: qty 2

## 2020-07-06 MED ORDER — DEXMEDETOMIDINE (PRECEDEX) IN NS 20 MCG/5ML (4 MCG/ML) IV SYRINGE
PREFILLED_SYRINGE | INTRAVENOUS | Status: DC | PRN
  Administered 2020-07-06: 8 ug via INTRAVENOUS
  Administered 2020-07-06: 12 ug via INTRAVENOUS

## 2020-07-06 MED ORDER — ACETAMINOPHEN 10 MG/ML IV SOLN
INTRAVENOUS | Status: AC
  Filled 2020-07-06: qty 100

## 2020-07-06 MED ORDER — FENTANYL CITRATE (PF) 100 MCG/2ML IJ SOLN
INTRAMUSCULAR | Status: AC
  Filled 2020-07-06: qty 2

## 2020-07-06 MED ORDER — SUCCINYLCHOLINE CHLORIDE 20 MG/ML IJ SOLN
INTRAMUSCULAR | Status: DC | PRN
  Administered 2020-07-06: 200 mg via INTRAVENOUS

## 2020-07-06 MED ORDER — PROPOFOL 500 MG/50ML IV EMUL
INTRAVENOUS | Status: AC
  Filled 2020-07-06: qty 50

## 2020-07-06 MED ORDER — PROPOFOL 10 MG/ML IV BOLUS
INTRAVENOUS | Status: DC | PRN
  Administered 2020-07-06: 200 mg via INTRAVENOUS

## 2020-07-06 MED ORDER — ACETAMINOPHEN 10 MG/ML IV SOLN
INTRAVENOUS | Status: DC | PRN
  Administered 2020-07-06: 1000 mg via INTRAVENOUS

## 2020-07-06 MED ORDER — DEXAMETHASONE SODIUM PHOSPHATE 10 MG/ML IJ SOLN
INTRAMUSCULAR | Status: DC | PRN
  Administered 2020-07-06: 10 mg via INTRAVENOUS

## 2020-07-06 MED ORDER — MAGNESIUM SULFATE 2 GM/50ML IV SOLN
2.0000 g | Freq: Once | INTRAVENOUS | Status: AC
  Administered 2020-07-06: 2 g via INTRAVENOUS
  Filled 2020-07-06: qty 50

## 2020-07-06 MED ORDER — MIDAZOLAM HCL 2 MG/2ML IJ SOLN
INTRAMUSCULAR | Status: DC | PRN
  Administered 2020-07-06: 2 mg via INTRAVENOUS

## 2020-07-06 SURGICAL SUPPLY — 48 items
BLADE SURG 15 STRL LF DISP TIS (BLADE) ×1 IMPLANT
BLADE SURG 15 STRL SS (BLADE) ×1
CHLORAPREP W/TINT 26 (MISCELLANEOUS) ×2 IMPLANT
COVER TIP SHEARS 8 DVNC (MISCELLANEOUS) ×1 IMPLANT
COVER TIP SHEARS 8MM DA VINCI (MISCELLANEOUS) ×1
COVER WAND RF STERILE (DRAPES) ×2 IMPLANT
DEFOGGER SCOPE WARMER CLEARIFY (MISCELLANEOUS) ×2 IMPLANT
DERMABOND ADVANCED (GAUZE/BANDAGES/DRESSINGS) ×1
DERMABOND ADVANCED .7 DNX12 (GAUZE/BANDAGES/DRESSINGS) ×1 IMPLANT
DRAPE ARM DVNC X/XI (DISPOSABLE) IMPLANT
DRAPE COLUMN DVNC XI (DISPOSABLE) IMPLANT
DRAPE DA VINCI XI ARM (DISPOSABLE)
DRAPE DA VINCI XI COLUMN (DISPOSABLE)
ELECT CAUTERY BLADE TIP 2.5 (TIP) ×2
ELECT REM PT RETURN 9FT ADLT (ELECTROSURGICAL) ×2
ELECTRODE CAUTERY BLDE TIP 2.5 (TIP) ×1 IMPLANT
ELECTRODE REM PT RTRN 9FT ADLT (ELECTROSURGICAL) ×1 IMPLANT
GLOVE SURG ENC MOIS LTX SZ6.5 (GLOVE) ×4 IMPLANT
GLOVE SURG UNDER LTX SZ7 (GLOVE) ×4 IMPLANT
GOWN STRL REUS W/ TWL LRG LVL3 (GOWN DISPOSABLE) ×3 IMPLANT
GOWN STRL REUS W/TWL LRG LVL3 (GOWN DISPOSABLE) ×3
KIT PINK PAD W/HEAD ARE REST (MISCELLANEOUS) ×2
KIT PINK PAD W/HEAD ARM REST (MISCELLANEOUS) ×1 IMPLANT
LABEL OR SOLS (LABEL) ×2 IMPLANT
MANIFOLD NEPTUNE II (INSTRUMENTS) ×2 IMPLANT
NEEDLE HYPO 22GX1.5 SAFETY (NEEDLE) ×2 IMPLANT
NEEDLE INSUFFLATION 14GA 120MM (NEEDLE) IMPLANT
NS IRRIG 500ML POUR BTL (IV SOLUTION) ×2 IMPLANT
OBTURATOR OPTICAL STANDARD 8MM (TROCAR) ×1
OBTURATOR OPTICAL STND 8 DVNC (TROCAR) ×1
OBTURATOR OPTICALSTD 8 DVNC (TROCAR) ×1 IMPLANT
PACK LAP CHOLECYSTECTOMY (MISCELLANEOUS) ×2 IMPLANT
PENCIL ELECTRO HAND CTR (MISCELLANEOUS) ×4 IMPLANT
SEAL CANN UNIV 5-8 DVNC XI (MISCELLANEOUS) ×3 IMPLANT
SEAL XI 5MM-8MM UNIVERSAL (MISCELLANEOUS) ×3
SET TUBE SMOKE EVAC HIGH FLOW (TUBING) ×2 IMPLANT
SOLUTION ELECTROLUBE (MISCELLANEOUS) ×2 IMPLANT
STRIP CLOSURE SKIN 1/2X4 (GAUZE/BANDAGES/DRESSINGS) ×2 IMPLANT
SUT ETHIBOND NAB MO 7 #0 18IN (SUTURE) ×2 IMPLANT
SUT MNCRL 4-0 (SUTURE) ×1
SUT MNCRL 4-0 27XMFL (SUTURE) ×1
SUT STRATAFIX PDS 30 CT-1 (SUTURE) IMPLANT
SUT VIC AB 3-0 SH 27 (SUTURE) ×1
SUT VIC AB 3-0 SH 27X BRD (SUTURE) ×1 IMPLANT
SUT VICRYL 0 AB UR-6 (SUTURE) ×2 IMPLANT
SUT VLOC 0 9X23 GS-22 BLUE (SUTURE) IMPLANT
SUT VLOC 90 S/L VL9 GS22 (SUTURE) IMPLANT
SUTURE MNCRL 4-0 27XMF (SUTURE) ×1 IMPLANT

## 2020-07-06 NOTE — Op Note (Signed)
Attempted robotic, converted to open incisional hernia repair  Indications: Stranagulated ventral hernia  Pre-operative Diagnosis: ventral hernia  Post-operative Diagnosis: ventral hernia  Surgeon: Fredirick Maudlin   Assistants: none  Anesthesia: General endotracheal anesthesia  Procedure Details  The patient was seen in the Holding Room. The risks, benefits, complications, treatment options, and expected outcomes were discussed with the patient. The possibilities of reaction to medication, pulmonary aspiration, perforation of viscus, bleeding, recurrent infection, the need for additional procedures, failure to diagnose a condition, and creating a complication requiring transfusion or operation were discussed with the patient. The patient concurred with the proposed plan, giving informed consent.  The site of surgery properly noted/marked. The patient was taken to Operating Room # 4, identified as Christina Sanders and the procedure verified as Ventral Herniorrhaphy. A Time Out was held and the above information confirmed.  Perioperative antibiotics were administered.  The patient was placed supine.  After establishing general anesthesia, the abdomen was prepped and draped in standard fashion.  As robot assisted laparoscopic hernia repair was intended, I entered the abdomen in the right upper quadrant using Optiview technique and a 5 mm standard laparoscopic trocar.  Upon insufflation, I was able to survey the intra-abdominal compartment.  There was very limited space due to extensive adhesions from prior surgery.  After a careful evaluation of the operative field, I felt the risk was too significant to proceed robotically and elected to convert to an open procedure.  A one-to-one mixture of 0.25% bupivacaine and 1% lidocaine with epinephrine was injected circumferentially around the hernia defect.  I then made an incision that encompassed a portion of the previous midline incision as well as an  infraumbilical incision in a curvilinear fashion..  Dissection was carried down to the hernia sac which was then circumferentially freed from the underlying fascia.  The fascial defect was identified.  The hernia sac was excised.  There was ischemic fat present within the hernia sac.  I elected to remove this, in order to facilitate reduction of the remaining hernia contents, as well as to avoid potential necrosis and infection.  Small vessels were cauterized with electrocautery and the fat was excised.  The remaining hernia contents appeared viable.  They were reduced into the defect.  Intact fascia was identified circumferentially around the defect.  As the patient presented to the hospital with sepsis and there is concern for systemic infection, I elected not to use a mesh prosthesis, despite her morbid obesity, knowing that she will have a higher rate of recurrence.  I closed the fascia primarily with multiple interrupted 0 Ethibond sutures.  Liposomal bupivacaine was injected along the fascial planes.  The soft tissue was irrigated and closed in layers with interrupted 3-0 Vicryl.  Hemostasis was confirmed.  The skin was closed with a 4-0 Monocryl subcuticular closure.  The laparoscopic port site was closed with running subcuticular 4-0 Monocryl. Dermabond and Steri-Strips were applied at the end of the operation.    Instrument, sponge, and needle counts were correct prior to closure and at the conclusion of the case.   Findings: Extensive intra-abdominal adhesions making a laparoscopic approach prohibitive.  Ischemic fat contained within a strangulated hernia adjacent to the umbilicus, but actually derived from the patient's prior surgical incision.  Estimated Blood Loss:  Minimal         Drains: None         Total IV Fluids: See anesthesia record         Specimens: None  Implants: None         Complications: Need to convert to open procedure due to hostile abdomen          Disposition: Postanesthesia care unit with subsequent readmission to the floor         Condition: stable  Attending Attestation: I was present and scrubbed for the entire procedure.

## 2020-07-06 NOTE — Progress Notes (Addendum)
Patient's temperature decreased to 100.  Potassium phosphate was ordered to replenish low phosphorus level.  Will continue to monitor.  Christene Slates

## 2020-07-06 NOTE — Anesthesia Procedure Notes (Signed)
Procedure Name: Intubation Date/Time: 07/06/2020 8:40 AM Performed by: Aline Brochure, CRNA Pre-anesthesia Checklist: Patient identified, Emergency Drugs available, Suction available and Patient being monitored Patient Re-evaluated:Patient Re-evaluated prior to induction Oxygen Delivery Method: Circle system utilized Preoxygenation: Pre-oxygenation with 100% oxygen Induction Type: IV induction Ventilation: Mask ventilation without difficulty Laryngoscope Size: McGraph and 3 Grade View: Grade I Tube type: Oral Tube size: 7.0 mm Number of attempts: 1 Airway Equipment and Method: Stylet and Video-laryngoscopy Placement Confirmation: ETT inserted through vocal cords under direct vision,  positive ETCO2 and breath sounds checked- equal and bilateral Secured at: 21 cm Tube secured with: Tape Dental Injury: Teeth and Oropharynx as per pre-operative assessment

## 2020-07-06 NOTE — Transfer of Care (Signed)
Immediate Anesthesia Transfer of Care Note  Patient: Christina Sanders  Procedure(s) Performed: HERNIA REPAIR UMBILICAL ADULT (N/A )  Patient Location: PACU  Anesthesia Type:General  Level of Consciousness: sedated  Airway & Oxygen Therapy: Patient connected to face mask oxygen  Post-op Assessment: Post -op Vital signs reviewed and stable  Post vital signs: Reviewed and stable  Last Vitals:  Vitals Value Taken Time  BP 121/64 07/06/20 1046  Temp    Pulse 86 07/06/20 1047  Resp 23 07/06/20 1047  SpO2 99 % 07/06/20 1047  Vitals shown include unvalidated device data.  Last Pain:  Vitals:   07/06/20 0429  TempSrc: Oral  PainSc:          Complications: No complications documented.

## 2020-07-06 NOTE — Anesthesia Postprocedure Evaluation (Signed)
Anesthesia Post Note  Patient: Christina Sanders  Procedure(s) Performed: HERNIA REPAIR UMBILICAL ADULT (N/A )  Patient location during evaluation: PACU Anesthesia Type: General Level of consciousness: awake and alert Pain management: pain level controlled Vital Signs Assessment: post-procedure vital signs reviewed and stable Respiratory status: spontaneous breathing, nonlabored ventilation, respiratory function stable and patient connected to nasal cannula oxygen Cardiovascular status: blood pressure returned to baseline and stable Postop Assessment: no apparent nausea or vomiting Anesthetic complications: no   No complications documented.   Last Vitals:  Vitals:   07/06/20 1115 07/06/20 1130  BP: 112/76 110/80  Pulse: 83 81  Resp: 19 (!) 21  Temp:  (!) 36.2 C  SpO2: 99% 100%    Last Pain:  Vitals:   07/06/20 1130  TempSrc:   PainSc: 0-No pain                 Precious Haws Amaree Leeper

## 2020-07-06 NOTE — Progress Notes (Signed)
PROGRESS NOTE    Christina Sanders  WUJ:[id number] DOB: [birth year] DOA: 07/04/2020 PCP: Janie Morning, DO    Brief Narrative:  40 year old female with history of non-Hodgkin's lymphoma status post surgical resection, no other major medical issues, chronic incisional ventral hernia presented with sudden onset of fever, diarrhea, generalized weakness and palpitations reportedly after eating some salad at work.  In the emergency room temperature 103.3.  Blood pressure stable.  Abnormal CT scan consistent with incarcerated paramedial hernia and no evidence of intestinal obstruction.  Pancreatic calcification leukocytosis and elevated procalcitonin.   Assessment & Plan:   Active Problems:   Sepsis ([place])   Strangulated ventral hernia  SIRS present on admission with leukocytosis and tachycardia and tachypnea: Suspected viral gastroenteritis, however with evidence of leukocytosis and elevated procalcitonin, treated with broad-spectrum antibiotics including cefepime and Flagyl.  Continue monitoring.  Blood cultures negative so far.  Urine culture was not collected, however no urinary symptoms.  Incarcerated ventral hernia: Seen by surgery.  Taken to operating room today for repair of hernia.  Management as per surgery.  Electrolyte abnormalities including hypokalemia/severe hypophosphatemia: Probably due to GI loss. Replaced aggressively.  Will further replace IV magnesium.  Recheck phosphorus, magnesium in the morning.     DVT prophylaxis: Lovenox   Code Status: Full code Family Communication: Twin sister at bedside Disposition Plan: Status is: Inpatient  Remains inpatient appropriate because:Inpatient level of care appropriate due to severity of illness   Dispo: The patient is from: Home              Anticipated d/c is to: Home              Patient currently is not medically stable to d/c.   Difficult to place patient No         Consultants:   General surgery  Procedures:    Schedule for ventral hernia repair today.  Antimicrobials:  Anti-infectives (From admission, onward)   Start     Dose/Rate Route Frequency Ordered Stop   07/05/20 0300  [MAR Hold]  ceFEPIme (MAXIPIME) 2 g in sodium chloride 0.9 % 100 mL IVPB        (MAR Hold since Sun 07/06/2020 at 0831.Hold Reason: Transfer to a Procedural area.)   2 g 200 mL/hr over 30 Minutes Intravenous Every 8 hours 07/05/20 0248     07/05/20 0230  ceFEPIme (MAXIPIME) 2 g in sodium chloride 0.9 % 100 mL IVPB  Status:  Discontinued        2 g 200 mL/hr over 30 Minutes Intravenous  Once 07/05/20 0220 07/05/20 0247   07/05/20 0230  [MAR Hold]  metroNIDAZOLE (FLAGYL) IVPB 500 mg        (MAR Hold since Sun 07/06/2020 at [place].Hold Reason: Transfer to a Procedural area.)   500 mg 100 mL/hr over 60 Minutes Intravenous Every 8 hours 07/05/20 0220     07/05/20 0100  piperacillin-tazobactam (ZOSYN) IVPB 3.375 g        3.375 g 100 mL/hr over 30 Minutes Intravenous  Once 07/05/20 0050 07/05/20 0156         Subjective: I examined patient before she was going for surgery.  Overnight events noted.  She had 3 loose bowel movement since yesterday morning.  Still has moderate abdominal pain.  Denies any nausea and is wondering about her eating. Patient had a fever 102 overnight. Surgery seen the patient, decided to take her to surgery for hernia repair.  Objective: Vitals:   07/06/20 1045 07/06/20  1100 07/06/20 1115 07/06/20 1130  BP: 121/64 121/68 112/76 110/80  Pulse: 85 84 83 81  Resp: 18 (!) 23 19 (!) 21  Temp: 98.8 F (37.1 C)   (!) 97.2 F (36.2 C)  TempSrc:      SpO2: 100% 100% 99% 100%  Weight:      Height:        Intake/Output Summary (Last 24 hours) at 07/06/2020 1145 Last data filed at 07/06/2020 1042 Gross per 24 hour  Intake 2512.62 ml  Output --  Net 2512.62 ml   Filed Weights   07/04/20 2036 07/05/20 0658  Weight: (!) 138.8 kg (!) 142.3 kg    Examination: Examination performed in the morning  rounds.  General: Looks comfortable.  On room air. Cardiovascular: S1-S2 normal.  No added sounds. Respiratory: Bilateral clear. Gastrointestinal: Soft.  Mildly distended, obese and pendulous.  Mild tenderness along the midline with palpable nonreducible ventral hernia. Ext: No edema or cyanosis.     Data Reviewed: I have personally reviewed following labs and imaging studies  CBC: Recent Labs  Lab 07/04/20 2049 07/05/20 0210 07/06/20 0013  WBC 15.7* 18.6* 12.6*  NEUTROABS  --  17.5* 11.4*  HGB 12.2 10.9* 10.3*  HCT 37.6 33.3* 31.8*  MCV 79.5* 79.7* 79.9*  PLT 325 263 459   Basic Metabolic Panel: Recent Labs  Lab 07/04/20 2049 07/05/20 0210 07/06/20 0013  NA 137 136 136  K 3.4* 3.3* 3.4*  CL 103 103 108  CO2 23 24 18*  GLUCOSE 111* 111* 121*  BUN 12 9 9   CREATININE 0.84 0.85 0.83  CALCIUM 8.9 8.2* 7.9*  MG  --   --  1.7  PHOS  --   --  <1.0*   GFR: Estimated Creatinine Clearance: 125 mL/min (by C-G formula based on SCr of 0.83 mg/dL). Liver Function Tests: Recent Labs  Lab 07/04/20 2049 07/05/20 0210  AST 20 19  ALT 18 17  ALKPHOS 62 53  BILITOT 0.7 0.7  PROT 8.8* 8.0  ALBUMIN 3.9 3.4*   Recent Labs  Lab 07/04/20 2049  LIPASE 34   No results for input(s): AMMONIA in the last 168 hours. Coagulation Profile: Recent Labs  Lab 07/05/20 0210  INR 1.4*   Cardiac Enzymes: No results for input(s): CKTOTAL, CKMB, CKMBINDEX, TROPONINI in the last 168 hours. BNP (last 3 results) No results for input(s): PROBNP in the last 8760 hours. HbA1C: No results for input(s): HGBA1C in the last 72 hours. CBG: No results for input(s): GLUCAP in the last 168 hours. Lipid Profile: No results for input(s): CHOL, HDL, LDLCALC, TRIG, CHOLHDL, LDLDIRECT in the last 72 hours. Thyroid Function Tests: No results for input(s): TSH, T4TOTAL, FREET4, T3FREE, THYROIDAB in the last 72 hours. Anemia Panel: No results for input(s): VITAMINB12, FOLATE, FERRITIN, TIBC, IRON,  RETICCTPCT in the last 72 hours. Sepsis Labs: Recent Labs  Lab 07/05/20 0100 07/05/20 0210 07/05/20 0803 07/06/20 0013 07/06/20 0802  PROCALCITON 2.62  --   --   --   --   LATICACIDVEN  --  1.8 1.0 1.2 1.3    Recent Results (from the past 240 hour(s))  Blood culture (routine x 2)     Status: None (Preliminary result)   Collection Time: 07/04/20  8:50 PM   Specimen: Right Antecubital; Blood  Result Value Ref Range Status   Specimen Description RIGHT ANTECUBITAL  Final   Special Requests   Final    BOTTLES DRAWN AEROBIC AND ANAEROBIC Blood Culture results may not  be optimal due to an inadequate volume of blood received in culture bottles   Culture   Final    NO GROWTH 1 DAY Performed at [facility], [address] [place], [place] [address]    Report Status PENDING  Incomplete  Blood culture (routine x 2)     Status: None (Preliminary result)   Collection Time: 07/04/20  8:52 PM   Specimen: BLOOD  Result Value Ref Range Status   Specimen Description BLOOD  RIGHT FOREARM  Final   Special Requests   Final    BOTTLES DRAWN AEROBIC AND ANAEROBIC Blood Culture adequate volume   Culture   Final    NO GROWTH 1 DAY Performed at [facility], [address] [place], [place] [address]    Report Status PENDING  Incomplete  Resp Panel by RT-PCR (Flu A&B, Covid) Nasopharyngeal Swab     Status: None   Collection Time: 07/05/20 12:38 AM   Specimen: Nasopharyngeal Swab; Nasopharyngeal(NP) swabs in vial transport medium  Result Value Ref Range Status   SARS Coronavirus 2 by RT PCR NEGATIVE NEGATIVE Final    Comment: (NOTE) SARS-CoV-2 target nucleic acids are NOT DETECTED.  The SARS-CoV-2 RNA is generally detectable in upper respiratory specimens during the acute phase of infection. The lowest concentration of SARS-CoV-2 viral copies this assay can detect is 138 copies/mL. A negative result does not preclude SARS-Cov-2 infection and should not be used as the sole  basis for treatment or other patient management decisions. A negative result may occur with  improper specimen collection/handling, submission of specimen other than nasopharyngeal swab, presence of viral mutation(s) within the areas targeted by this assay, and inadequate number of viral copies(<138 copies/mL). A negative result must be combined with clinical observations, patient history, and epidemiological information. The expected result is Negative.  Fact Sheet for Patients:  EntrepreneurPulse.com.au  Fact Sheet for Healthcare Providers:  IncredibleEmployment.be  This test is no t yet approved or cleared by the [place] FDA and  has been authorized for detection and/or diagnosis of SARS-CoV-2 by FDA under an Emergency Use Authorization (EUA). This EUA will remain  in effect (meaning this test can be used) for the duration of the COVID-19 declaration under Section 564(b)(1) of the Act, 21 U.S.C.section 360bbb-3(b)(1), unless the authorization is terminated  or revoked sooner.       Influenza A by PCR NEGATIVE NEGATIVE Final   Influenza B by PCR NEGATIVE NEGATIVE Final    Comment: (NOTE) The Xpert Xpress SARS-CoV-2/FLU/RSV plus assay is intended as an aid in the diagnosis of influenza from Nasopharyngeal swab specimens and should not be used as a sole basis for treatment. Nasal washings and aspirates are unacceptable for Xpert Xpress SARS-CoV-2/FLU/RSV testing.  Fact Sheet for Patients: EntrepreneurPulse.com.au  Fact Sheet for Healthcare Providers: IncredibleEmployment.be  This test is not yet approved or cleared by the [place] FDA and has been authorized for detection and/or diagnosis of SARS-CoV-2 by FDA under an Emergency Use Authorization (EUA). This EUA will remain in effect (meaning this test can be used) for the duration of the COVID-19 declaration under Section 564(b)(1) of the Act,  21 U.S.C. section 360bbb-3(b)(1), unless the authorization is terminated or revoked.  Performed at [facility], [address], [place], [place] [address]          Radiology Studies: CT ABDOMEN PELVIS W CONTRAST  Result Date: 07/05/2020 CLINICAL DATA:  Abdominal pain and fever. EXAM: CT ABDOMEN AND PELVIS WITH CONTRAST TECHNIQUE: Multidetector  CT imaging of the abdomen and pelvis was performed using the standard protocol following bolus administration of intravenous contrast. CONTRAST:  154mL OMNIPAQUE IOHEXOL 300 MG/ML  SOLN COMPARISON:  None. FINDINGS: Lower chest: Mild linear atelectasis is seen within the left lung base. Hepatobiliary: Mild diffuse fatty infiltration of the liver parenchyma is noted. No focal liver abnormality is seen. Numerous subcentimeter gallstones are seen within the lumen of an otherwise normal-appearing gallbladder. Pancreas: A 4.4 cm x 3.5 cm coarse calcification is seen extending superiorly from the junction of the pancreatic body and head. An adjacent 1.6 cm x 0.9 cm calcification is also noted (axial CT image 22, CT series number 2). Multiple smaller calcifications are seen adjacent to the posterior and lateral aspects of the pancreatic head. Spleen: Normal in size without focal abnormality. Adrenals/Urinary Tract: Adrenal glands are unremarkable. Kidneys are normal, without obstructing renal calculi or hydronephrosis. A 7 mm diameter cystic appearing area is seen within the mid right kidney. A 2 mm nonobstructing renal stone is seen within the upper pole of the left kidney. A 1.7 cm x 1.1 cm partially calcified soft tissue mass is seen adjacent to the medial aspect of the mid left kidney. The urinary bladder is empty and subsequently limited in evaluation. Stomach/Bowel: Stomach is within normal limits. Appendix appears normal. No evidence of bowel dilatation. Vascular/Lymphatic: No significant vascular findings are present. Subcentimeter para-aortic lymph  nodes are seen. Reproductive: An 11.6 cm x 9.9 cm x 10.1 cm partially calcified uterine fibroid is seen within the uterine fundus. Additional smaller noncalcified uterine fibroids are noted within the lower uterine segment. The bilateral adnexa are unremarkable. Other: There is a 4.6 cm x 5.5 cm x 5.7 cm left-sided para umbilical hernia. This contains fat and a 4.0 cm x 3.5 cm x 3.8 cm lobulated area of fluid attenuation. A mild-to-moderate amount of inflammatory fat stranding is also seen. No entering or exiting bowel loops are identified. No abdominopelvic ascites. Musculoskeletal: Moderate to marked severity degenerative changes are seen at the level of L5-S1. IMPRESSION: 1. Findings consistent with an incarcerated para umbilical hernia containing fat and a lobulated collection of fluid, as described above. This area of fluid is felt to less likely represent a short segment of incarcerated small bowel, however, this cannot be excluded. Surgical consultation is recommended. 2. Cholelithiasis. 3. Large pancreatic calcification with multiple smaller peripancreatic calcifications which may represent sequelae associated with chronic pancreatitis. Given the size of the larger calcification, MRI correlation is recommended to exclude an underlying neoplasm. 4. Partially calcified soft tissue mass adjacent to the mid left kidney which may represent a partially calcified lymph node. 5. Large calcified and noncalcified uterine fibroids. 6. 2 mm nonobstructing renal stone within the left kidney. 7. Moderate to marked severity degenerative changes at the level of L5-S1. Electronically Signed   By: Virgina Norfolk M.D.   On: 07/05/2020 00:28        Scheduled Meds: . [MAR Hold] enoxaparin (LOVENOX) injection  0.5 mg/kg Subcutaneous Q24H  . [MAR Hold] potassium chloride  20 mEq Oral BID   Continuous Infusions: . sodium chloride 100 mL/hr at 07/06/20 0827  . [MAR Hold] ceFEPime (MAXIPIME) IV 2 g (07/05/20 2109)  .  magnesium sulfate bolus IVPB    . [MAR Hold] metronidazole 500 mg (07/06/20 0218)     LOS: 1 day    Time spent: 32 minutes    Barb Merino, MD [facility] Pager [phone]

## 2020-07-06 NOTE — Plan of Care (Signed)
Continuing with plan of care. 

## 2020-07-06 NOTE — Progress Notes (Signed)
Notified Sharion Settler regarding patient's fever of 102.5. Appropriate orders were placed for ibuprofen and 1000mg  of tylenol.  Labs were also ordered.  Lab called with critical phosphorus level of <1.  Awaiting orders.  Will continue to monitor.  Christene Slates

## 2020-07-06 NOTE — Progress Notes (Signed)
Briefly, this is a 40 year old woman who presented with symptoms consistent with gastroenteritis, however she also was found to have a strangulated ventral hernia.  I took her to the operating room today and performed a primary repair, due to the concern for potential mesh infection should a prosthesis be placed in the setting of sepsis.  See operative note for details.

## 2020-07-07 ENCOUNTER — Inpatient Hospital Stay: Payer: BC Managed Care – PPO

## 2020-07-07 ENCOUNTER — Encounter: Payer: Self-pay | Admitting: General Surgery

## 2020-07-07 LAB — BLOOD GAS, VENOUS
Acid-base deficit: 1.2 mmol/L (ref 0.0–2.0)
Bicarbonate: 22.2 mmol/L (ref 20.0–28.0)
O2 Saturation: 93.1 %
Patient temperature: 37
pCO2, Ven: 32 mmHg — ABNORMAL LOW (ref 44.0–60.0)
pH, Ven: 7.45 — ABNORMAL HIGH (ref 7.250–7.430)
pO2, Ven: 64 mmHg — ABNORMAL HIGH (ref 32.0–45.0)

## 2020-07-07 LAB — BASIC METABOLIC PANEL
Anion gap: 7 (ref 5–15)
BUN: 11 mg/dL (ref 6–20)
CO2: 20 mmol/L — ABNORMAL LOW (ref 22–32)
Calcium: 7.8 mg/dL — ABNORMAL LOW (ref 8.9–10.3)
Chloride: 111 mmol/L (ref 98–111)
Creatinine, Ser: 0.71 mg/dL (ref 0.44–1.00)
GFR, Estimated: >60 mL/min (ref >60)
Glucose, Bld: 140 mg/dL — ABNORMAL HIGH (ref 70–99)
Potassium: 3.9 mmol/L (ref 3.5–5.1)
Sodium: 138 mmol/L (ref 135–145)

## 2020-07-07 LAB — MRSA PCR SCREENING: MRSA by PCR: NEGATIVE

## 2020-07-07 LAB — CBC WITH DIFFERENTIAL/PLATELET
Abs Immature Granulocytes: 0.11 10*3/uL — ABNORMAL HIGH (ref 0.00–0.07)
Basophils Absolute: 0 10*3/uL (ref 0.0–0.1)
Basophils Relative: 0 %
Eosinophils Absolute: 0 10*3/uL (ref 0.0–0.5)
Eosinophils Relative: 0 %
HCT: 30.8 % — ABNORMAL LOW (ref 36.0–46.0)
Hemoglobin: 10.1 g/dL — ABNORMAL LOW (ref 12.0–15.0)
Immature Granulocytes: 1 %
Lymphocytes Relative: 7 %
Lymphs Abs: 1 10*3/uL (ref 0.7–4.0)
MCH: 26 pg (ref 26.0–34.0)
MCHC: 32.8 g/dL (ref 30.0–36.0)
MCV: 79.4 fL — ABNORMAL LOW (ref 80.0–100.0)
Monocytes Absolute: 0.8 10*3/uL (ref 0.1–1.0)
Monocytes Relative: 5 %
Neutro Abs: 13 10*3/uL — ABNORMAL HIGH (ref 1.7–7.7)
Neutrophils Relative %: 87 %
Platelets: 244 10*3/uL (ref 150–400)
RBC: 3.88 MIL/uL (ref 3.87–5.11)
RDW: 16.5 % — ABNORMAL HIGH (ref 11.5–15.5)
Smear Review: NORMAL
WBC: 14.9 10*3/uL — ABNORMAL HIGH (ref 4.0–10.5)
nRBC: 0 % (ref 0.0–0.2)

## 2020-07-07 LAB — LACTIC ACID, PLASMA
Lactic Acid, Venous: 1.4 mmol/L (ref 0.5–1.9)
Lactic Acid, Venous: 1.4 mmol/L (ref 0.5–1.9)

## 2020-07-07 LAB — PHOSPHORUS: Phosphorus: 1.3 mg/dL — ABNORMAL LOW (ref 2.5–4.6)

## 2020-07-07 LAB — BRAIN NATRIURETIC PEPTIDE: B Natriuretic Peptide: 86.1 pg/mL (ref 0.0–100.0)

## 2020-07-07 LAB — MAGNESIUM: Magnesium: 2.1 mg/dL (ref 1.7–2.4)

## 2020-07-07 MED ORDER — LABETALOL HCL 5 MG/ML IV SOLN
10.0000 mg | INTRAVENOUS | Status: DC | PRN
  Administered 2020-07-07 – 2020-07-08 (×3): 10 mg via INTRAVENOUS
  Filled 2020-07-07 (×3): qty 4

## 2020-07-07 MED ORDER — IOHEXOL 350 MG/ML SOLN
75.0000 mL | Freq: Once | INTRAVENOUS | Status: AC | PRN
  Administered 2020-07-07: 75 mL via INTRAVENOUS

## 2020-07-07 MED ORDER — METRONIDAZOLE 500 MG PO TABS
500.0000 mg | ORAL_TABLET | Freq: Three times a day (TID) | ORAL | Status: DC
  Administered 2020-07-07 – 2020-07-08 (×4): 500 mg via ORAL
  Filled 2020-07-07 (×6): qty 1

## 2020-07-07 MED ORDER — FUROSEMIDE 10 MG/ML IJ SOLN
40.0000 mg | INTRAMUSCULAR | Status: AC
  Administered 2020-07-07: 40 mg via INTRAVENOUS
  Filled 2020-07-07: qty 4

## 2020-07-07 MED ORDER — POTASSIUM PHOSPHATES 15 MMOLE/5ML IV SOLN
30.0000 mmol | Freq: Once | INTRAVENOUS | Status: AC
  Administered 2020-07-07: 30 mmol via INTRAVENOUS
  Filled 2020-07-07: qty 10

## 2020-07-07 MED ORDER — TRAZODONE HCL 50 MG PO TABS: 50.0000 mg | ORAL_TABLET | Freq: Every evening | ORAL | Status: DC | PRN

## 2020-07-07 NOTE — Progress Notes (Addendum)
Pt is not feeling so well. BP 99 temp, HR 98, resp 32, 172/99, sats 88% on room air. I put her on 3 liters- sats 96%, RT is hearing crackles, she is doing shallow breathing.Dr Sloan Leiter came to see the patient. Chest x ray and lasix ordered.

## 2020-07-07 NOTE — Progress Notes (Signed)
   07/07/20 1500  Clinical Encounter Type  Visited With Patient  Visit Type Initial;Social support;Spiritual support  Referral From Nurse  Consult/Referral To Chaplain   Chaplain responded to a rapid response. Chaplain checked on PT and nurses. Chaplain ministered with presence. The nurse told Chaplain, "everything was ok".

## 2020-07-07 NOTE — Progress Notes (Signed)
   07/07/20 1520  Assess: MEWS Score  Temp 99.4 F (37.4 C)  BP (!) 172/99  Pulse Rate 100  Resp (!) 32  SpO2 (!) 88 %  O2 Device Room Air  Assess: MEWS Score  MEWS Temp 0  MEWS Systolic 0  MEWS Pulse 0  MEWS RR 2  MEWS LOC 0  MEWS Score 2  MEWS Score Color Yellow  Assess: if the MEWS score is Yellow or Red  Were vital signs taken at a resting state? Yes  Focused Assessment No change from prior assessment  Early Detection of Sepsis Score *See Row Information* Low  MEWS guidelines implemented *See Row Information* Yes  Treat  MEWS Interventions Administered prn meds/treatments  Pain Score 2  Take Vital Signs  Increase Vital Sign Frequency  Yellow: Q 2hr X 2 then Q 4hr X 2, if remains yellow, continue Q 4hrs  Escalate  MEWS: Escalate Yellow: discuss with charge nurse/RN and consider discussing with provider and RRT  Notify: Charge Nurse/RN  Name of Charge Nurse/RN Notified Misty Burns  Date Charge Nurse/RN Notified 07/07/20  Time Charge Nurse/RN Notified 1520  Notify: Provider  Provider Name/Title Dr Sloan Leiter  Date Provider Notified 07/07/20  Time Provider Notified 1520  Notification Type Face-to-face  Notification Reason Change in status  Provider response At bedside  Date of Provider Response 07/07/20  Time of Provider Response 1520  Notify: Rapid Response  Name of Rapid Response RN Notified ICU charge nurse  Date Rapid Response Notified 07/07/20  Time Rapid Response Notified 1520

## 2020-07-07 NOTE — Progress Notes (Signed)
   07/06/20 2330  Assess: MEWS Score  Temp (!) 101.6 F (38.7 C)  BP 115/63  Pulse Rate 97  Resp 20  SpO2 93 %  O2 Device Room Air  Assess: MEWS Score  MEWS Temp 2  MEWS Systolic 0  MEWS Pulse 0  MEWS RR 0  MEWS LOC 0  MEWS Score 2  MEWS Score Color Yellow  Assess: if the MEWS score is Yellow or Red  Were vital signs taken at a resting state? Yes  Focused Assessment Change from prior assessment (see assessment flowsheet)  Early Detection of Sepsis Score *See Row Information* High  Treat  MEWS Interventions Administered prn meds/treatments  Pain Scale 0-10  Pain Score 0  Take Vital Signs  Increase Vital Sign Frequency  Yellow: Q 2hr X 2 then Q 4hr X 2, if remains yellow, continue Q 4hrs  Escalate  MEWS: Escalate Yellow: discuss with charge nurse/RN and consider discussing with provider and RRT  Notify: Charge Nurse/RN  Name of Charge Nurse/RN Notified Karie Fetch  Date Charge Nurse/RN Notified 07/07/20  Time Charge Nurse/RN Notified 0005  Notify: Provider  Provider Name/Title Did not notify provider at this time  Notify: Rapid Response  Name of Rapid Response RN Notified N/A

## 2020-07-07 NOTE — Progress Notes (Signed)
PROGRESS NOTE    Christina Sanders  ERX:[id number] DOB: Aug 15, 1980 DOA: 07/04/2020 PCP: Janie Morning, DO    Brief Narrative:  40 year old female with history of non-Hodgkin's lymphoma status post surgical resection, no other major medical issues, chronic incisional ventral hernia presented with sudden onset of fever, diarrhea, generalized weakness and palpitations reportedly after eating some salad at work.  In the emergency room temperature 103.3.  Blood pressure stable.  Abnormal CT scan consistent with incarcerated paramedial hernia and no evidence of intestinal obstruction.  Pancreatic calcification leukocytosis and elevated procalcitonin. Patient was treated with broad-spectrum antibiotics and ultimately underwent hernia repair.   Assessment & Plan:   Active Problems:   Sepsis ([place])   Strangulated ventral hernia  SIRS present on admission with leukocytosis and tachycardia and tachypnea: Suspected viral gastroenteritis, however with evidence of leukocytosis and elevated procalcitonin, treated with broad-spectrum antibiotics including cefepime and Flagyl.  Continue monitoring.  Blood cultures negative so far.   We will change to oral Flagyl.  Continue cefepime and Flagyl today due to persistent fever and leukocytosis.  Likely can discontinue by tomorrow.  Incarcerated ventral hernia: Seen by surgery.  Surgical repair.  Post op management as per surgery.  Mobilize in the hallway.  Hypophosphatemia: Persistent due to GI loss.  Replace aggressively today.  Replace magnesium and potassium.  Recheck tomorrow morning.    DVT prophylaxis: Lovenox   Code Status: Full code Family Communication: none today. Disposition Plan: Status is: Inpatient  Remains inpatient appropriate because:Inpatient level of care appropriate due to severity of illness   Dispo: The patient is from: Home              Anticipated d/c is to: Home              Patient currently is not medically stable to d/c.    Difficult to place patient No         Consultants:   General surgery  Procedures:   Ventral hernia repair 4/10 by surgery.  Antimicrobials:  Anti-infectives (From admission, onward)   Start     Dose/Rate Route Frequency Ordered Stop   07/07/20 0845  metroNIDAZOLE (FLAGYL) tablet 500 mg        500 mg Oral Every 8 hours 07/07/20 0746     07/05/20 0300  ceFEPIme (MAXIPIME) 2 g in sodium chloride 0.9 % 100 mL IVPB        2 g 200 mL/hr over 30 Minutes Intravenous Every 8 hours 07/05/20 0248     07/05/20 0230  ceFEPIme (MAXIPIME) 2 g in sodium chloride 0.9 % 100 mL IVPB  Status:  Discontinued        2 g 200 mL/hr over 30 Minutes Intravenous  Once 07/05/20 0220 07/05/20 0247   07/05/20 0230  metroNIDAZOLE (FLAGYL) IVPB 500 mg  Status:  Discontinued        500 mg 100 mL/hr over 60 Minutes Intravenous Every 8 hours 07/05/20 0220 07/07/20 0746   07/05/20 0100  piperacillin-tazobactam (ZOSYN) IVPB 3.375 g        3.375 g 100 mL/hr over 30 Minutes Intravenous  Once 07/05/20 0050 07/05/20 0156         Subjective: Patient seen and examined.  She had restless night, could not get good sleep.  Some soreness but no pain.  More frustrated being in the hospital, has discomfort. 2 loose bowel movement for last 24 hours.  She had low-grade temperature overnight.  Denies any nausea vomiting.  Tolerating soft diet.  Objective: Vitals:   07/07/20 0133 07/07/20 0335 07/07/20 0752 07/07/20 1130  BP: 122/75 (!) 141/68 (!) 151/90 (!) 146/72  Pulse: 97 91 83 85  Resp: 18 17 20 18   Temp: 99.9 F (37.7 C) 98.6 F (37 C) 99 F (37.2 C) 98.6 F (37 C)  TempSrc:   Oral Oral  SpO2: 94% 91% 93% 94%  Weight:      Height:        Intake/Output Summary (Last 24 hours) at 07/07/2020 1203 Last data filed at 07/07/2020 1000 Gross per 24 hour  Intake 480.85 ml  Output --  Net 480.85 ml   Filed Weights   07/04/20 2036 07/05/20 0658  Weight: (!) 138.8 kg (!) 142.3 kg    Examination:    General: Looks comfortable.  On room air.  Anxious. Cardiovascular: S1-S2 normal.  No added sounds. Respiratory: Bilateral clear. Gastrointestinal: Soft.  Mildly distended, obese and pendulous.  Mild tenderness along the midline.  Surgical incision dry and clean.  Mild tenderness. Ext: No edema or cyanosis.     Data Reviewed: I have personally reviewed following labs and imaging studies  CBC: Recent Labs  Lab 07/04/20 2049 07/05/20 0210 07/06/20 0013 07/07/20 0521  WBC 15.7* 18.6* 12.6* 14.9*  NEUTROABS  --  17.5* 11.4* 13.0*  HGB 12.2 10.9* 10.3* 10.1*  HCT 37.6 33.3* 31.8* 30.8*  MCV 79.5* 79.7* 79.9* 79.4*  PLT 325 263 234 287   Basic Metabolic Panel: Recent Labs  Lab 07/04/20 2049 07/05/20 0210 07/06/20 0013 07/07/20 0521  NA 137 136 136 138  K 3.4* 3.3* 3.4* 3.9  CL 103 103 108 111  CO2 23 24 18* 20*  GLUCOSE 111* 111* 121* 140*  BUN 12 9 9 11   CREATININE 0.84 0.85 0.83 0.71  CALCIUM 8.9 8.2* 7.9* 7.8*  MG  --   --  1.7 2.1  PHOS  --   --  <1.0* 1.3*   GFR: Estimated Creatinine Clearance: 129.7 mL/min (by C-G formula based on SCr of 0.71 mg/dL). Liver Function Tests: Recent Labs  Lab 07/04/20 2049 07/05/20 0210  AST 20 19  ALT 18 17  ALKPHOS 62 53  BILITOT 0.7 0.7  PROT 8.8* 8.0  ALBUMIN 3.9 3.4*   Recent Labs  Lab 07/04/20 2049  LIPASE 34   No results for input(s): AMMONIA in the last 168 hours. Coagulation Profile: Recent Labs  Lab 07/05/20 0210  INR 1.4*   Cardiac Enzymes: No results for input(s): CKTOTAL, CKMB, CKMBINDEX, TROPONINI in the last 168 hours. BNP (last 3 results) No results for input(s): PROBNP in the last 8760 hours. HbA1C: No results for input(s): HGBA1C in the last 72 hours. CBG: No results for input(s): GLUCAP in the last 168 hours. Lipid Profile: No results for input(s): CHOL, HDL, LDLCALC, TRIG, CHOLHDL, LDLDIRECT in the last 72 hours. Thyroid Function Tests: No results for input(s): TSH, T4TOTAL, FREET4,  T3FREE, THYROIDAB in the last 72 hours. Anemia Panel: No results for input(s): VITAMINB12, FOLATE, FERRITIN, TIBC, IRON, RETICCTPCT in the last 72 hours. Sepsis Labs: Recent Labs  Lab 07/05/20 0100 07/05/20 0210 07/05/20 0803 07/06/20 0013 07/06/20 0802  PROCALCITON 2.62  --   --   --   --   LATICACIDVEN  --  1.8 1.0 1.2 1.3    Recent Results (from the past 240 hour(s))  Blood culture (routine x 2)     Status: None (Preliminary result)   Collection Time: 07/04/20  8:50 PM   Specimen: Right Antecubital; Blood  Result Value Ref Range Status   Specimen Description RIGHT ANTECUBITAL  Final   Special Requests   Final    BOTTLES DRAWN AEROBIC AND ANAEROBIC Blood Culture results may not be optimal due to an inadequate volume of blood received in culture bottles   Culture   Final    NO GROWTH 2 DAYS Performed at [facility], [address], [place], [place] [address]    Report Status PENDING  Incomplete  Blood culture (routine x 2)     Status: None (Preliminary result)   Collection Time: 07/04/20  8:52 PM   Specimen: BLOOD  Result Value Ref Range Status   Specimen Description BLOOD  RIGHT FOREARM  Final   Special Requests   Final    BOTTLES DRAWN AEROBIC AND ANAEROBIC Blood Culture adequate volume   Culture   Final    NO GROWTH 2 DAYS Performed at [facility], [address], [place], [place] [address]    Report Status PENDING  Incomplete  Resp Panel by RT-PCR (Flu A&B, Covid) Nasopharyngeal Swab     Status: None   Collection Time: 07/05/20 12:38 AM   Specimen: Nasopharyngeal Swab; Nasopharyngeal(NP) swabs in vial transport medium  Result Value Ref Range Status   SARS Coronavirus 2 by RT PCR NEGATIVE NEGATIVE Final    Comment: (NOTE) SARS-CoV-2 target nucleic acids are NOT DETECTED.  The SARS-CoV-2 RNA is generally detectable in upper respiratory specimens during the acute phase of infection. The lowest concentration of SARS-CoV-2 viral copies this  assay can detect is 138 copies/mL. A negative result does not preclude SARS-Cov-2 infection and should not be used as the sole basis for treatment or other patient management decisions. A negative result may occur with  improper specimen collection/handling, submission of specimen other than nasopharyngeal swab, presence of viral mutation(s) within the areas targeted by this assay, and inadequate number of viral copies(<138 copies/mL). A negative result must be combined with clinical observations, patient history, and epidemiological information. The expected result is Negative.  Fact Sheet for Patients:  EntrepreneurPulse.com.au  Fact Sheet for Healthcare Providers:  IncredibleEmployment.be  This test is no t yet approved or cleared by the [place] FDA and  has been authorized for detection and/or diagnosis of SARS-CoV-2 by FDA under an Emergency Use Authorization (EUA). This EUA will remain  in effect (meaning this test can be used) for the duration of the COVID-19 declaration under Section 564(b)(1) of the Act, 21 U.S.C.section 360bbb-3(b)(1), unless the authorization is terminated  or revoked sooner.       Influenza A by PCR NEGATIVE NEGATIVE Final   Influenza B by PCR NEGATIVE NEGATIVE Final    Comment: (NOTE) The Xpert Xpress SARS-CoV-2/FLU/RSV plus assay is intended as an aid in the diagnosis of influenza from Nasopharyngeal swab specimens and should not be used as a sole basis for treatment. Nasal washings and aspirates are unacceptable for Xpert Xpress SARS-CoV-2/FLU/RSV testing.  Fact Sheet for Patients: EntrepreneurPulse.com.au  Fact Sheet for Healthcare Providers: IncredibleEmployment.be  This test is not yet approved or cleared by the [place] FDA and has been authorized for detection and/or diagnosis of SARS-CoV-2 by FDA under an Emergency Use Authorization (EUA). This EUA will  remain in effect (meaning this test can be used) for the duration of the COVID-19 declaration under Section 564(b)(1) of the Act, 21 U.S.C. section 360bbb-3(b)(1), unless the authorization is terminated or revoked.  Performed at [facility], [address], [place], [place] [address]  Radiology Studies: No results found.      Scheduled Meds: . enoxaparin (LOVENOX) injection  0.5 mg/kg Subcutaneous Q24H  . metroNIDAZOLE  500 mg Oral Q8H   Continuous Infusions: . sodium chloride Stopped (07/07/20 0209)  . ceFEPime (MAXIPIME) IV 2 g (07/07/20 8478)  . potassium PHOSPHATE IVPB (in mmol) 30 mmol (07/07/20 1024)     LOS: 2 days    Time spent: 32 minutes    Barb Merino, MD [facility] Pager [phone]

## 2020-07-07 NOTE — Progress Notes (Signed)
Rapid response follow-up: spoke with patient's RN, Danae Chen. Per the bedside RN, patient is "feeling much better". No acute distress. Bedside RN instructed to call RRT again if warranted.

## 2020-07-07 NOTE — Significant Event (Signed)
Rapid Response Event Note   Reason for Call :  Tachypneia  Initial Focused Assessment:  Erice- bedside RN stated that patient is tachypneic in the 30's and oxygen saturation dropped to 88% on room air.  BP 300'P systolic.     Interventions:  Patient placed on 2 liters of oxygen and morphine IV given.  Plan of Care:  Dr. Sloan Leiter ordered for 40 of lasix IV and a chest xray.     Event Summary:   MD Notified: Dr. Sloan Leiter Call Time:14:56 Arrival Time:1500 End [id number]  Penne Lash, RN

## 2020-07-07 NOTE — Progress Notes (Addendum)
[place] Hospital Day(s): 2.   Post op day(s): 1 Day Post-Op.   Interval History:  Patient seen and examined; sitting in chair No acute events or new complaints overnight.  She did have fever to 101.9F at 2330 last night Patient reports that she "isn't doing bad" but still does not feel well Mild abdominal soreness No nausea, emesis Leukocytosis this morning to 14.6K Maintaining normal renal function; sCr - 0.71; UO - unmeasured Improved hypophosphatemia to 1.3; otherwise no electrolyte derangements  Currently on soft diet; tolerating well; having bowel function Continues on Cefepime and Flagyl  Vital signs in last 24 hours: [min-max] current  Temp:  [97.2 F (36.2 C)-101.6 F (38.7 C)] 98.6 F (37 C) (04/11 0335) Pulse Rate:  [81-97] 91 (04/11 0335) Resp:  [16-23] 17 (04/11 0335) BP: (110-167)/(63-99) 141/68 (04/11 0335) SpO2:  [91 %-100 %] 91 % (04/11 0335)     Height: 5\' 4"  (162.6 cm) Weight: (!) 142.3 kg BMI (Calculated): 53.82   Intake/Output last 2 shifts:  04/10 0701 - 04/11 0700 In: 1685.9 [I.V.:1005.9; IV Piggyback:680] Out: -    Physical Exam:  Constitutional: alert, cooperative and no distress; sitting in recliner Respiratory: breathing non-labored at rest  Cardiovascular: regular rate and sinus rhythm  Gastrointestinal: Obese, soft, mild left sided soreness, no appreciable distension but this is difficult given body habitus, no rebound/guarding Integumentary: Umbilical incision is CDI with steri-strips, no erythema or drainage   Labs:  CBC Latest Ref Rng & Units 07/07/2020 07/06/2020 07/05/2020  WBC 4.0 - 10.5 K/uL 14.9(H) 12.6(H) 18.6(H)  Hemoglobin 12.0 - 15.0 g/dL 10.1(L) 10.3(L) 10.9(L)  Hematocrit 36.0 - 46.0 % 30.8(L) 31.8(L) 33.3(L)  Platelets 150 - 400 K/uL 244 234 263   CMP Latest Ref Rng & Units 07/07/2020 07/06/2020 07/05/2020  Glucose 70 - 99 mg/dL 140(H) 121(H) 111(H)  BUN 6 - 20 mg/dL 11 9 9   Creatinine  0.44 - 1.00 mg/dL 0.71 0.83 0.85  Sodium 135 - 145 mmol/L 138 136 136  Potassium 3.5 - 5.1 mmol/L 3.9 3.4(L) 3.3(L)  Chloride 98 - 111 mmol/L 111 108 103  CO2 22 - 32 mmol/L 20(L) 18(L) 24  Calcium 8.9 - 10.3 mg/dL 7.8(L) 7.9(L) 8.2(L)  Total Protein 6.5 - 8.1 g/dL - - 8.0  Total Bilirubin 0.3 - 1.2 mg/dL - - 0.7  Alkaline Phos 38 - 126 U/L - - 53  AST 15 - 41 U/L - - 19  ALT 0 - 44 U/L - - 17     Imaging studies: No new pertinent imaging studies   Assessment/Plan: 40 y.o. female with fever overnight, otherwise doing well, 1 Day Post-Op s/p incisional hernia repair for strangulated ventral hernia   - Okay to continue soft/regular diet  - Wean from IVF support  - Continue IV Abx (Cefepime, Flagyl)  - Monitor abdominal examination; on-going bowel function  - Pain control prn; antiemetics prn  - Monitor fever curve; leukocytosis --. morning CBC  - Mobilization encouraged   - Further management per primary service     - Discharge Planning: Given her fever overnight and persistent leukocytosis, I do suspect she will benefit from 24 hours more of observation and IV ABx. If she remains without fever x24 hours and leukocytosis improves, she can likely be DC'd tomorrow  All of the above findings and recommendations were discussed with the patient, and the medical team, and all of patient's questions were answered to her expressed satisfaction.  -- Edison Simon, PA-C  [facility] 07/07/2020, 7:27 AM [phone] M-F: 7am - 4pm  I saw and evaluated the patient.  I agree with the above documentation, exam, and plan, which I have edited where appropriate. Fredirick Maudlin  10:56 AM

## 2020-07-08 DIAGNOSIS — J189 Pneumonia, unspecified organism: Secondary | ICD-10-CM

## 2020-07-08 DIAGNOSIS — K436 Other and unspecified ventral hernia with obstruction, without gangrene: Secondary | ICD-10-CM

## 2020-07-08 DIAGNOSIS — A419 Sepsis, unspecified organism: Principal | ICD-10-CM

## 2020-07-08 LAB — PHOSPHORUS: Phosphorus: 2.4 mg/dL — ABNORMAL LOW (ref 2.5–4.6)

## 2020-07-08 LAB — CBC WITH DIFFERENTIAL/PLATELET
Abs Immature Granulocytes: 0.14 10*3/uL — ABNORMAL HIGH (ref 0.00–0.07)
Basophils Absolute: 0 10*3/uL (ref 0.0–0.1)
Basophils Relative: 0 %
Eosinophils Absolute: 0 10*3/uL (ref 0.0–0.5)
Eosinophils Relative: 0 %
HCT: 32.6 % — ABNORMAL LOW (ref 36.0–46.0)
Hemoglobin: 10.3 g/dL — ABNORMAL LOW (ref 12.0–15.0)
Immature Granulocytes: 1 %
Lymphocytes Relative: 10 %
Lymphs Abs: 1.5 10*3/uL (ref 0.7–4.0)
MCH: 25.4 pg — ABNORMAL LOW (ref 26.0–34.0)
MCHC: 31.6 g/dL (ref 30.0–36.0)
MCV: 80.5 fL (ref 80.0–100.0)
Monocytes Absolute: 1.1 10*3/uL — ABNORMAL HIGH (ref 0.1–1.0)
Monocytes Relative: 8 %
Neutro Abs: 12 10*3/uL — ABNORMAL HIGH (ref 1.7–7.7)
Neutrophils Relative %: 81 %
Platelets: 274 10*3/uL (ref 150–400)
RBC: 4.05 MIL/uL (ref 3.87–5.11)
RDW: 16.7 % — ABNORMAL HIGH (ref 11.5–15.5)
WBC: 14.8 10*3/uL — ABNORMAL HIGH (ref 4.0–10.5)
nRBC: 0 % (ref 0.0–0.2)

## 2020-07-08 LAB — BASIC METABOLIC PANEL
Anion gap: 8 (ref 5–15)
BUN: 10 mg/dL (ref 6–20)
CO2: 23 mmol/L (ref 22–32)
Calcium: 8.1 mg/dL — ABNORMAL LOW (ref 8.9–10.3)
Chloride: 109 mmol/L (ref 98–111)
Creatinine, Ser: 0.64 mg/dL (ref 0.44–1.00)
GFR, Estimated: >60 mL/min (ref >60)
Glucose, Bld: 127 mg/dL — ABNORMAL HIGH (ref 70–99)
Potassium: 3.5 mmol/L (ref 3.5–5.1)
Sodium: 140 mmol/L (ref 135–145)

## 2020-07-08 LAB — MAGNESIUM: Magnesium: 2 mg/dL (ref 1.7–2.4)

## 2020-07-08 MED ORDER — PIPERACILLIN-TAZOBACTAM 3.375 G IVPB
3.3750 g | Freq: Three times a day (TID) | INTRAVENOUS | Status: DC
  Administered 2020-07-08 – 2020-07-09 (×3): 3.375 g via INTRAVENOUS

## 2020-07-08 NOTE — Progress Notes (Signed)
Pharmacy Antibiotic Note  Christina Sanders is a 40 y.o. female admitted on 07/04/2020 with infection (aspiration PNA - also covering for intra-abdominal infection).    Pharmacy has been consulted for Zosyn dosing  Plan: Ordered Zosyn 3.375g q8h (4 hr extended infusion) per indication and renal fxn.    Pharmacy will continue to follow SCr and adjust abx dosing if warranted.  Height: 5\' 4"  (162.6 cm) Weight: (!) 142.3 kg (313 lb 11.4 oz) IBW/kg (Calculated) : 54.7  Temp (24hrs), Avg:99 F (37.2 C), Min:98 F (36.7 C), Max:100 F (37.8 C)  Recent Labs  Lab 07/04/20 2049 07/05/20 0210 07/05/20 0803 07/06/20 0013 07/06/20 0802 07/07/20 0521 07/07/20 2017 07/07/20 2306 07/08/20 0425  WBC 15.7* 18.6*  --  12.6*  --  14.9*  --   --  14.8*  CREATININE 0.84 0.85  --  0.83  --  0.71  --   --  0.64  LATICACIDVEN 1.9 1.8 1.0 1.2 1.3  --  1.4 1.4  --     Estimated Creatinine Clearance: 129.7 mL/min (by C-G formula based on SCr of 0.64 mg/dL).    Allergies  Allergen Reactions  . Heparin Nausea And Vomiting    Antimicrobials this admission: 4/9 Cefepime >> 4/12 4/9 Flagyl >> 4/12 4/9 Zosyn >> x 1 4/12 Zosyn >>  Microbiology results: 4/8 BCx: NG3 days 4/11 MRSA PCR NEG FLU/COVID NEG  Thank you for allowing pharmacy to be a part of this patient's care.  Lu Duffel, PharmD, BCPS Clinical Pharmacist 07/08/2020 10:38 AM

## 2020-07-08 NOTE — Progress Notes (Addendum)
[place] Hospital Day(s): 3.   Post op day(s): 2 Days Post-Op.   Interval History:  Late in the afternoon yesterday (04/11) had episode of trouble breathing and RR called. She had CXR and CTA Chest which was concerning for likely right PNA. She continues on Cefepime and Flagyl  Patient seen and examined Patient reports she is feeling better this morning but continues to have conversational dyspnea this morning No nausea, emesis No fever in last 24 hours Leukocytosis has remained stable at 14.8K Renal functioning remains normal, sCr - 0.64, UO - 1.3L + unmeasured Mild hypophosphatemia to 2.4; no other electrolyte derangements  She is on soft diet; tolerating well continues to have bowel function   Vital signs in last 24 hours: [min-max] current  Temp:  [98 F (36.7 C)-100 F (37.8 C)] 98.6 F (37 C) (04/12 0455) Pulse Rate:  [82-100] 82 (04/12 0455) Resp:  [18-32] 24 (04/12 0455) BP: (146-184)/(72-108) 171/108 (04/12 0455) SpO2:  [88 %-96 %] 94 % (04/12 0455)     Height: 5\' 4"  (162.6 cm) Weight: (!) 142.3 kg BMI (Calculated): 53.82   Intake/Output last 2 shifts:  04/11 0701 - 04/12 0700 In: 325 [P.O.:25; IV Piggyback:300] Out: 1300 [Urine:1300]   Physical Exam:  Constitutional: alert, cooperative and no distress; sitting in recliner Respiratory: On [place], she does continue to have conversational dyspnea  Cardiovascular: regular rate and sinus rhythm  Gastrointestinal: Obese, soft, mild left sided soreness, no appreciable distension but this is difficult given body habitus, no rebound/guarding Integumentary: Umbilical incision is CDI with steri-strips, no erythema or drainage   Labs:  CBC Latest Ref Rng & Units 07/08/2020 07/07/2020 07/06/2020  WBC 4.0 - 10.5 K/uL 14.8(H) 14.9(H) 12.6(H)  Hemoglobin 12.0 - 15.0 g/dL 10.3(L) 10.1(L) 10.3(L)  Hematocrit 36.0 - 46.0 % 32.6(L) 30.8(L) 31.8(L)  Platelets 150 - 400 K/uL 274 244 234   CMP  Latest Ref Rng & Units 07/08/2020 07/07/2020 07/06/2020  Glucose 70 - 99 mg/dL 127(H) 140(H) 121(H)  BUN 6 - 20 mg/dL 10 11 9   Creatinine 0.44 - 1.00 mg/dL 0.64 0.71 0.83  Sodium 135 - 145 mmol/L 140 138 136  Potassium 3.5 - 5.1 mmol/L 3.5 3.9 3.4(L)  Chloride 98 - 111 mmol/L 109 111 108  CO2 22 - 32 mmol/L 23 20(L) 18(L)  Calcium 8.9 - 10.3 mg/dL 8.1(L) 7.8(L) 7.9(L)  Total Protein 6.5 - 8.1 g/dL - - -  Total Bilirubin 0.3 - 1.2 mg/dL - - -  Alkaline Phos 38 - 126 U/L - - -  AST 15 - 41 U/L - - -  ALT 0 - 44 U/L - - -     Imaging studies:   CTA Chest (07/07/2020) personally reviewed showing right lung infiltrates, large calcification of the pancreas, and radiologist report reviewed:  IMPRESSION: 1. No evidence of pulmonary embolus. 2. Multifocal bilateral pneumonia, greatest in the right upper and right lower lobes. 3. Small bilateral pleural effusions, right greater than left. 4. 2.3 cm right lobe thyroid hypodensity. Recommend nonemergent outpatient thyroid US if not previously performed. (Ref: J Am Coll Radiol. 2015 Feb;12(2): 143-50). 5. Minimal pneumoperitoneum consistent with recent abdominal surgery for umbilical hernia repair. 6. Large pancreatic calcification partially visualized. Please refer to preceding CT abdomen and pelvis report.   Assessment/Plan: 40 y.o. female with likely PNA 2 Days Post-Op s/p incisional hernia repair for strangulated ventral hernia   - Okay to continue soft/regular diet             -  Wean from IVF support             - Continue IV Abx (Cefepime, Flagyl)             - Monitor abdominal examination; on-going bowel function             - Pain control prn; antiemetics prn             - Monitor fever curve  - Monitor leukocytosis; stable; morning labs   - Mobilization encouraged              - Further management per primary service ; we will follow    All of the above findings and recommendations were discussed with the patient, and the  medical team, and all of patient's questions were answered to her expressed satisfaction.  -- Edison Simon, PA-C [facility] 07/08/2020, 7:13 AM [phone] M-F: 7am - 4pm

## 2020-07-08 NOTE — Progress Notes (Signed)
PROGRESS NOTE    Christina Sanders  NKN:[id number] DOB: [birth year] DOA: 07/04/2020 PCP: Janie Morning, DO    Brief Narrative:  40 year old female with history of non-Hodgkin's lymphoma status post surgical resection, no other major medical issues, chronic incisional ventral hernia presented with sudden onset of fever, diarrhea, generalized weakness and palpitations reportedly after eating some salad at work.  In the emergency room temperature 103.3.  Blood pressure stable.  Abnormal CT scan consistent with incarcerated paramedial hernia and no evidence of intestinal obstruction.  Pancreatic calcification leukocytosis and elevated procalcitonin. Patient was treated with broad-spectrum antibiotics and ultimately underwent hernia repair.  4/12-on [place]. No complaints this am.  Assessment & Plan:   Active Problems:   Sepsis ([place])   Strangulated ventral hernia  Strangulated ventral hernia -status post repair postop day 2  General surgery following  On IV antibiotics cefepime and Flagyl  IV antibiotic switched to IV Zosyn as found with possible pneumonia questionable aspiration  Pain management  Antiemetics  PT consulted to increase mobilization    SIRS  present on admission with leukocytosis and tachycardia and tachypnea: Sepsis ruled out Blood culture to date negative MRSA negative   SOB-found with pna on CT scan. More on RL, concern for aspiration.  Negative for PE.  Switch iv abx to zosyn Wean off 02 as tolerated Encourage incentive spirometer   Hypophosphatemia: Persistent due to GI loss.   Replacing  Continue to monitor       DVT prophylaxis: Lovenox   Code Status: Full code Family Communication: Family at bedside Disposition Plan: Status is: Inpatient  Remains inpatient appropriate because:Inpatient level of care appropriate due to severity of illness   Dispo: The patient is from: Home              Anticipated d/c is to: Home              Patient currently is  not medically stable to d/c.   Difficult to place patient No         Consultants:   General surgery  Procedures:   Ventral hernia repair 4/10 by surgery.  Antimicrobials:  Anti-infectives (From admission, onward)   Start     Dose/Rate Route Frequency Ordered Stop   07/08/20 1615  piperacillin-tazobactam (ZOSYN) IVPB 3.375 g        3.375 g 12.5 mL/hr over 240 Minutes Intravenous Every 8 hours 07/08/20 1517     07/07/20 0845  metroNIDAZOLE (FLAGYL) tablet 500 mg  Status:  Discontinued        500 mg Oral Every 8 hours 07/07/20 0746 07/08/20 1036   07/05/20 0300  ceFEPIme (MAXIPIME) 2 g in sodium chloride 0.9 % 100 mL IVPB  Status:  Discontinued        2 g 200 mL/hr over 30 Minutes Intravenous Every 8 hours 07/05/20 0248 07/08/20 1036   07/05/20 0230  ceFEPIme (MAXIPIME) 2 g in sodium chloride 0.9 % 100 mL IVPB  Status:  Discontinued        2 g 200 mL/hr over 30 Minutes Intravenous  Once 07/05/20 0220 07/05/20 0247   07/05/20 0230  metroNIDAZOLE (FLAGYL) IVPB 500 mg  Status:  Discontinued        500 mg 100 mL/hr over 60 Minutes Intravenous Every 8 hours 07/05/20 0220 07/07/20 0746   07/05/20 0100  piperacillin-tazobactam (ZOSYN) IVPB 3.375 g        3.375 g 100 mL/hr over 30 Minutes Intravenous  Once 07/05/20 0050 07/05/20 0156  Subjective: Shortness of breath at rest and with ambulation.  No chest pain  Objective: Vitals:   07/08/20 0747 07/08/20 0752 07/08/20 1146 07/08/20 1524  BP: (!) 159/95  (!) 159/87 (!) 162/93  Pulse: 79  85 84  Resp: (!) 32  (!) 32 (!) 32  Temp: 98.3 F (36.8 C)  98.2 F (36.8 C) 98.3 F (36.8 C)  TempSrc: Oral  Oral Oral  SpO2: 98% 95% 95% 95%  Weight:      Height:        Intake/Output Summary (Last 24 hours) at 07/08/2020 1728 Last data filed at 07/08/2020 1712 Gross per 24 hour  Intake 556.71 ml  Output 1100 ml  Net -543.29 ml   Filed Weights   07/04/20 2036 07/05/20 0658  Weight: (!) 138.8 kg (!) 142.3 kg     Examination:  Nad, calm, in bed Decreased breath sounds at bases, no rhonchi Regular S1-S2 no murmurs or gallops Incision intact, nontender mildly distended positive bowel sounds No edema Alert oriented x3 grossly intact    Data Reviewed: I have personally reviewed following labs and imaging studies  CBC: Recent Labs  Lab 07/04/20 2049 07/05/20 0210 07/06/20 0013 07/07/20 0521 07/08/20 0425  WBC 15.7* 18.6* 12.6* 14.9* 14.8*  NEUTROABS  --  17.5* 11.4* 13.0* 12.0*  HGB 12.2 10.9* 10.3* 10.1* 10.3*  HCT 37.6 33.3* 31.8* 30.8* 32.6*  MCV 79.5* 79.7* 79.9* 79.4* 80.5  PLT 325 263 234 244 371   Basic Metabolic Panel: Recent Labs  Lab 07/04/20 2049 07/05/20 0210 07/06/20 0013 07/07/20 0521 07/08/20 0425  NA 137 136 136 138 140  K 3.4* 3.3* 3.4* 3.9 3.5  CL 103 103 108 111 109  CO2 23 24 18* 20* 23  GLUCOSE 111* 111* 121* 140* 127*  BUN 12 9 9 11 10   CREATININE 0.84 0.85 0.83 0.71 0.64  CALCIUM 8.9 8.2* 7.9* 7.8* 8.1*  MG  --   --  1.7 2.1 2.0  PHOS  --   --  <1.0* 1.3* 2.4*   GFR: Estimated Creatinine Clearance: 129.7 mL/min (by C-G formula based on SCr of 0.64 mg/dL). Liver Function Tests: Recent Labs  Lab 07/04/20 2049 07/05/20 0210  AST 20 19  ALT 18 17  ALKPHOS 62 53  BILITOT 0.7 0.7  PROT 8.8* 8.0  ALBUMIN 3.9 3.4*   Recent Labs  Lab 07/04/20 2049  LIPASE 34   No results for input(s): AMMONIA in the last 168 hours. Coagulation Profile: Recent Labs  Lab 07/05/20 0210  INR 1.4*   Cardiac Enzymes: No results for input(s): CKTOTAL, CKMB, CKMBINDEX, TROPONINI in the last 168 hours. BNP (last 3 results) No results for input(s): PROBNP in the last 8760 hours. HbA1C: No results for input(s): HGBA1C in the last 72 hours. CBG: No results for input(s): GLUCAP in the last 168 hours. Lipid Profile: No results for input(s): CHOL, HDL, LDLCALC, TRIG, CHOLHDL, LDLDIRECT in the last 72 hours. Thyroid Function Tests: No results for input(s): TSH,  T4TOTAL, FREET4, T3FREE, THYROIDAB in the last 72 hours. Anemia Panel: No results for input(s): VITAMINB12, FOLATE, FERRITIN, TIBC, IRON, RETICCTPCT in the last 72 hours. Sepsis Labs: Recent Labs  Lab 07/05/20 0100 07/05/20 0210 07/06/20 0013 07/06/20 0802 07/07/20 2017 07/07/20 2306  PROCALCITON 2.62  --   --   --   --   --   LATICACIDVEN  --    < > 1.2 1.3 1.4 1.4   < > = values in this interval not displayed.  Recent Results (from the past 240 hour(s))  Blood culture (routine x 2)     Status: None (Preliminary result)   Collection Time: 07/04/20  8:50 PM   Specimen: Right Antecubital; Blood  Result Value Ref Range Status   Specimen Description RIGHT ANTECUBITAL  Final   Special Requests   Final    BOTTLES DRAWN AEROBIC AND ANAEROBIC Blood Culture results may not be optimal due to an inadequate volume of blood received in culture bottles   Culture   Final    NO GROWTH 3 DAYS Performed at [facility], [address] [place], [place] [address]    Report Status PENDING  Incomplete  Blood culture (routine x 2)     Status: None (Preliminary result)   Collection Time: 07/04/20  8:52 PM   Specimen: BLOOD  Result Value Ref Range Status   Specimen Description BLOOD  RIGHT FOREARM  Final   Special Requests   Final    BOTTLES DRAWN AEROBIC AND ANAEROBIC Blood Culture adequate volume   Culture   Final    NO GROWTH 3 DAYS Performed at [facility], [address] [place], [place] [address]    Report Status PENDING  Incomplete  Resp Panel by RT-PCR (Flu A&B, Covid) Nasopharyngeal Swab     Status: None   Collection Time: 07/05/20 12:38 AM   Specimen: Nasopharyngeal Swab; Nasopharyngeal(NP) swabs in vial transport medium  Result Value Ref Range Status   SARS Coronavirus 2 by RT PCR NEGATIVE NEGATIVE Final    Comment: (NOTE) SARS-CoV-2 target nucleic acids are NOT DETECTED.  The SARS-CoV-2 RNA is generally detectable in upper respiratory specimens during  the acute phase of infection. The lowest concentration of SARS-CoV-2 viral copies this assay can detect is 138 copies/mL. A negative result does not preclude SARS-Cov-2 infection and should not be used as the sole basis for treatment or other patient management decisions. A negative result may occur with  improper specimen collection/handling, submission of specimen other than nasopharyngeal swab, presence of viral mutation(s) within the areas targeted by this assay, and inadequate number of viral copies(<138 copies/mL). A negative result must be combined with clinical observations, patient history, and epidemiological information. The expected result is Negative.  Fact Sheet for Patients:  EntrepreneurPulse.com.au  Fact Sheet for Healthcare Providers:  IncredibleEmployment.be  This test is no t yet approved or cleared by the [place] FDA and  has been authorized for detection and/or diagnosis of SARS-CoV-2 by FDA under an Emergency Use Authorization (EUA). This EUA will remain  in effect (meaning this test can be used) for the duration of the COVID-19 declaration under Section 564(b)(1) of the Act, 21 U.S.C.section 360bbb-3(b)(1), unless the authorization is terminated  or revoked sooner.       Influenza A by PCR NEGATIVE NEGATIVE Final   Influenza B by PCR NEGATIVE NEGATIVE Final    Comment: (NOTE) The Xpert Xpress SARS-CoV-2/FLU/RSV plus assay is intended as an aid in the diagnosis of influenza from Nasopharyngeal swab specimens and should not be used as a sole basis for treatment. Nasal washings and aspirates are unacceptable for Xpert Xpress SARS-CoV-2/FLU/RSV testing.  Fact Sheet for Patients: EntrepreneurPulse.com.au  Fact Sheet for Healthcare Providers: IncredibleEmployment.be  This test is not yet approved or cleared by the [place] FDA and has been authorized for detection and/or  diagnosis of SARS-CoV-2 by FDA under an Emergency Use Authorization (EUA). This EUA will remain in effect (meaning this test can be used) for the duration of  the COVID-19 declaration under Section 564(b)(1) of the Act, 21 U.S.C. section 360bbb-3(b)(1), unless the authorization is terminated or revoked.  Performed at [facility], [address] [place], [place] [address]   MRSA PCR Screening     Status: None   Collection Time: 07/07/20  9:05 PM   Specimen: Nasal Mucosa; Nasopharyngeal  Result Value Ref Range Status   MRSA by PCR NEGATIVE NEGATIVE Final    Comment:        The GeneXpert MRSA Assay (FDA approved for NASAL specimens only), is one component of a comprehensive MRSA colonization surveillance program. It is not intended to diagnose MRSA infection nor to guide or monitor treatment for MRSA infections. Performed at [facility], [address] [place], [place] [address]          Radiology Studies: CT ANGIO CHEST PE W OR WO CONTRAST  Result Date: 07/07/2020 CLINICAL DATA:  Shortness of breath, history of ventral hernia, history of non-Hodgkin lymphoma EXAM: CT ANGIOGRAPHY CHEST WITH CONTRAST TECHNIQUE: Multidetector CT imaging of the chest was performed using the standard protocol during bolus administration of intravenous contrast. Multiplanar CT image reconstructions and MIPs were obtained to evaluate the vascular anatomy. CONTRAST:  87mL OMNIPAQUE IOHEXOL 350 MG/ML SOLN COMPARISON:  07/07/2020 FINDINGS: Cardiovascular: This is a technically adequate evaluation of the pulmonary vasculature. There are no filling defects or pulmonary emboli. The heart is unremarkable without pericardial effusion. No evidence of thoracic aortic aneurysm or dissection. Mediastinum/Nodes: There is a 2.3 cm hypodensity right lobe thyroid. No pathologic mediastinal or hilar adenopathy. Trachea and esophagus are unremarkable. Lungs/Pleura: There is multifocal bilateral  airspace disease, with the dense consolidation seen within the superior segment right lower lobe and right upper lobe. Small bilateral pleural effusions are seen, right greater than left. No pneumothorax. Central airways are patent. Upper Abdomen: Minimal free gas within the upper abdomen consistent with recent umbilical hernia repair. Large pancreatic calcifications seen on previous abdominal CT partially visualized on this study, for which MRI was previously recommended. Musculoskeletal: No acute or destructive bony lesions. Reconstructed images demonstrate no additional findings. Biopsy-proven fibroadenoma left breast. Review of the MIP images confirms the above findings. IMPRESSION: 1. No evidence of pulmonary embolus. 2. Multifocal bilateral pneumonia, greatest in the right upper and right lower lobes. 3. Small bilateral pleural effusions, right greater than left. 4. 2.3 cm right lobe thyroid hypodensity. Recommend nonemergent outpatient thyroid US if not previously performed. (Ref: J Am Coll Radiol. 2015 Feb;12(2): 143-50). 5. Minimal pneumoperitoneum consistent with recent abdominal surgery for umbilical hernia repair. 6. Large pancreatic calcification partially visualized. Please refer to preceding CT abdomen and pelvis report. Electronically Signed   By: Randa Ngo M.D.   On: 07/07/2020 17:37   DG Chest Port 1 View  Result Date: 07/07/2020 CLINICAL DATA:  Shortness of breath. EXAM: PORTABLE CHEST 1 VIEW COMPARISON:  None. FINDINGS: The heart size and mediastinal contours are within normal limits. Pulmonary vascular congestion and mild diffuse interstitial thickening. Ill-defined consolidation in the right upper lobe. No pleural effusion or pneumothorax. No acute osseous abnormality. IMPRESSION: 1. Ill-defined consolidation in the right upper lobe may reflect pneumonia or asymmetric pulmonary edema. 2. Mild pulmonary vascular congestion and interstitial edema. Electronically Signed   By: Titus Dubin M.D.   On: 07/07/2020 15:47        Scheduled Meds: . enoxaparin (LOVENOX) injection  0.5 mg/kg Subcutaneous Q24H   Continuous Infusions: . piperacillin-tazobactam (ZOSYN)  IV 12.5 mL/hr at 07/08/20 1648  LOS: 3 days    Time spent: 35 minutes with more than 50% on [place], MD [facility] Pager [phone]

## 2020-07-08 NOTE — Progress Notes (Signed)
SATURATION QUALIFICATIONS: (This note is used to comply with regulatory documentation for home oxygen)  Patient Saturations on Room Air at Rest = 89%  Patient Saturations on Room Air while Ambulating =%  Patient Saturations on 3 Liters of oxygen while Ambulating = 92%  Please briefly explain why patient needs home oxygen: Patient is 89% on room air at rest. She dropped down to 80% with ambulation and oxygen had to be increased to 3L to maintain at sat of 92%

## 2020-07-09 DIAGNOSIS — I10 Essential (primary) hypertension: Secondary | ICD-10-CM

## 2020-07-09 LAB — CBC
HCT: 31.8 % — ABNORMAL LOW (ref 36.0–46.0)
Hemoglobin: 10.3 g/dL — ABNORMAL LOW (ref 12.0–15.0)
MCH: 25.2 pg — ABNORMAL LOW (ref 26.0–34.0)
MCHC: 32.4 g/dL (ref 30.0–36.0)
MCV: 77.9 fL — ABNORMAL LOW (ref 80.0–100.0)
Platelets: 256 10*3/uL (ref 150–400)
RBC: 4.08 MIL/uL (ref 3.87–5.11)
RDW: 16.8 % — ABNORMAL HIGH (ref 11.5–15.5)
WBC: 11.6 10*3/uL — ABNORMAL HIGH (ref 4.0–10.5)
nRBC: 0.2 % (ref 0.0–0.2)

## 2020-07-09 MED ORDER — AMOXICILLIN-POT CLAVULANATE 875-125 MG PO TABS: 1.0000 | ORAL_TABLET | Freq: Two times a day (BID) | ORAL | 0 refills | Status: AC

## 2020-07-09 MED ORDER — AMLODIPINE BESYLATE 5 MG PO TABS
5.0000 mg | ORAL_TABLET | Freq: Every day | ORAL | Status: DC
  Administered 2020-07-09: 5 mg via ORAL
  Filled 2020-07-09: qty 1

## 2020-07-09 MED ORDER — AMLODIPINE BESYLATE 5 MG PO TABS: 5.0000 mg | ORAL_TABLET | Freq: Every day | ORAL | 0 refills | Status: DC

## 2020-07-09 NOTE — Evaluation (Signed)
Physical Therapy Evaluation Patient Details Name: Christina Sanders MRN: [id number] DOB: 1980/07/31 Today's Date: 07/09/2020   History of Present Illness  Pt admitted for sepsis with complaints of fever, diarrhea, and weakness. History includes non-Hodgkins lymphoma s/p sx resection, and chronic ventral hernia. Pt is now s/p hernia repair on 4/10.  Clinical Impression  Pt is a pleasant 40 year old female who was admitted for sepsis. Pt performs bed mobility with cga, transfers with mod I, and ambulation with supervision and IV pole. Pt demonstrates deficits with endurance/mobility. Monitored O2 sats with exertion and relayed to MD. Decreased to 86% on RA, needs standing rest break to self recover to 91%. Would benefit from skilled PT to address above deficits and promote optimal return to PLOF. Recommend transition to [place] upon discharge from acute hospitalization.     Follow Up Recommendations Home health PT    Equipment Recommendations  None recommended by PT    Recommendations for Other Services       Precautions / Restrictions Precautions Precautions: None Restrictions Weight Bearing Restrictions: No      Mobility  Bed Mobility Overal bed mobility: Needs Assistance Bed Mobility: Supine to Sit     Supine to sit: Min guard     General bed mobility comments: safe technique with slight assist for upper body due to abdominal incision    Transfers Overall transfer level: Modified independent Equipment used: None             General transfer comment: able to push from seated surface. ONce standing, upright posture noted  Ambulation/Gait Ambulation/Gait assistance: Supervision Gait Distance (Feet): 100 Feet Assistive device: IV Pole Gait Pattern/deviations: Step-through pattern     General Gait Details: very slow gait speed with reciprocal gait. Uses IV pole for assistance. All mobility performed on RA with sats decreasing to 86%. With standing rest break, self  recovered to 91%.  Stairs            Wheelchair Mobility    Modified Rankin (Stroke Patients Only)       Balance Overall balance assessment: No apparent balance deficits (not formally assessed)                                           Pertinent Vitals/Pain Pain Assessment: No/denies pain    Home Living Family/patient expects to be discharged to:: Private residence Living Arrangements: Children;Other relatives (patient's son and nephew live with her.) Available Help at Discharge: Available PRN/intermittently;Family (with have assist from mother and sister) Type of Home: House Home Access: Stairs to enter Entrance Stairs-Rails: None Entrance Stairs-Number of Steps: 1 step to enter Home Layout: One level Home Equipment: None      Prior Function Level of Independence: Independent         Comments: indep prior with no falls. Currently teaching as Chiropractor.     Hand Dominance        Extremity/Trunk Assessment   Upper Extremity Assessment Upper Extremity Assessment: Overall WFL for tasks assessed    Lower Extremity Assessment Lower Extremity Assessment: Generalized weakness (B LE grossly 4/5)       Communication   Communication: No difficulties  Cognition Arousal/Alertness: Awake/alert Behavior During Therapy: WFL for tasks assessed/performed Overall Cognitive Status: Within Functional Limits for tasks assessed  General Comments      Exercises Other Exercises Other Exercises: educated on energy conservation regarding ADLs and mobility   Assessment/Plan    PT Assessment Patient needs continued PT services  PT Problem List Decreased strength;Decreased activity tolerance;Cardiopulmonary status limiting activity       PT Treatment Interventions Gait training;Functional mobility training;Balance training    PT Goals (Current goals can be found in the Care Plan  section)  Acute Rehab PT Goals Patient Stated Goal: to go home PT Goal Formulation: With patient Time For Goal Achievement: 07/23/20 Potential to Achieve Goals: Good    Frequency Min 2X/week   Barriers to discharge        Co-evaluation               AM-PAC PT "6 Clicks" Mobility  Outcome Measure Help needed turning from your back to your side while in a flat bed without using bedrails?: A Little Help needed moving from lying on your back to sitting on the side of a flat bed without using bedrails?: A Little Help needed moving to and from a bed to a chair (including a wheelchair)?: A Little Help needed standing up from a chair using your arms (e.g., wheelchair or bedside chair)?: A Little Help needed to walk in hospital room?: A Little Help needed climbing 3-5 steps with a railing? : A Little 6 Click Score: 18    End of Session   Activity Tolerance: Patient tolerated treatment well Patient left: in chair;with family/visitor present Nurse Communication: Mobility status PT Visit Diagnosis: Muscle weakness (generalized) (M62.81)    Time: 0034-9179 PT Time Calculation (min) (ACUTE ONLY): 29 min   Charges:   PT Evaluation $PT Eval Low Complexity: 1 Low PT Treatments $Gait Training: 8-22 mins        Greggory Stallion, PT, DPT [phone]   Christina Sanders 07/09/2020, 12:50 PM

## 2020-07-09 NOTE — Progress Notes (Signed)
[place] Hospital Day(s): 4.   Post op day(s): 3 Days Post-Op.   Interval History:  Patient seen and examined No acute events or new complaints overnight.  Patient reports she continues to feel better this morning She is still mildly SOB after exertion, but not requiring supplemental O2 Some abdominal soreness secondary to frequent coughing No fever, chills, nausea, emesis Leukocytosis improved now; down to 11.6K ABx switched to Zosyn on 04/12 She is on soft diet; tolerating well continues to have bowel function   Vital signs in last 24 hours: [min-max] current  Temp:  [98.2 F (36.8 C)-99.8 F (37.7 C)] 99.1 F (37.3 C) (04/13 0352) Pulse Rate:  [79-88] 84 (04/13 0511) Resp:  [20-32] 20 (04/13 0352) BP: (159-173)/(87-97) 165/91 (04/13 0352) SpO2:  [89 %-98 %] 93 % (04/13 0511)     Height: 5\' 4"  (162.6 cm) Weight: (!) 142.3 kg BMI (Calculated): 53.82   Intake/Output last 2 shifts:  04/12 0701 - 04/13 0700 In: 376.7 [P.O.:360; IV Piggyback:16.7] Out: 1500 [Urine:1500]   Physical Exam:  Constitutional: alert, cooperative and no distress Respiratory: on RA, conversational dyspnea improved Cardiovascular: regular rate and sinus rhythm  Gastrointestinal:Obese, soft, incisional soreness, no appreciable distension but this is difficult given body habitus, no rebound/guarding Integumentary:Umbilical incision is CDI with steri-strips, no erythema or drainage  Labs:  CBC Latest Ref Rng & Units 07/09/2020 07/08/2020 07/07/2020  WBC 4.0 - 10.5 K/uL 11.6(H) 14.8(H) 14.9(H)  Hemoglobin 12.0 - 15.0 g/dL 10.3(L) 10.3(L) 10.1(L)  Hematocrit 36.0 - 46.0 % 31.8(L) 32.6(L) 30.8(L)  Platelets 150 - 400 K/uL 256 274 244   CMP Latest Ref Rng & Units 07/08/2020 07/07/2020 07/06/2020  Glucose 70 - 99 mg/dL 127(H) 140(H) 121(H)  BUN 6 - 20 mg/dL 10 11 9   Creatinine 0.44 - 1.00 mg/dL 0.64 0.71 0.83  Sodium 135 - 145 mmol/L 140 138 136  Potassium 3.5 -  5.1 mmol/L 3.5 3.9 3.4(L)  Chloride 98 - 111 mmol/L 109 111 108  CO2 22 - 32 mmol/L 23 20(L) 18(L)  Calcium 8.9 - 10.3 mg/dL 8.1(L) 7.8(L) 7.9(L)  Total Protein 6.5 - 8.1 g/dL - - -  Total Bilirubin 0.3 - 1.2 mg/dL - - -  Alkaline Phos 38 - 126 U/L - - -  AST 15 - 41 U/L - - -  ALT 0 - 44 U/L - - -     Imaging studies: No new pertinent imaging studies   Assessment/Plan:  40 y.o. female overall doing better 3 Days Post-Op s/p incisional hernia repairfor strangulated ventral hernia   - Okay to continue soft/regular diet - Continue IV Abx (Zosyn); PO for home for PNA per medicine service - Monitor abdominal examination; on-going bowel function - Pain control prn; antiemetics prn - Monitor fever curve; afebrile x48 hours             - Monitor leukocytosis; improved             - Mobilization encouraged -Further management per primary service   - Discharge Planning: Nothing further to add from surgical perspective, I will update discharge instructions and follow up for our service. Abx for PNA per medicine. Discharge once medically stable. We will be available.    All of the above findings and recommendations were discussed with the patient, and the medical team, and all of patient's questions were answered to her expressed satisfaction.  -- Edison Simon, PA-C [facility] 07/09/2020, 7:33 AM [phone] M-F: 7am -  4pm

## 2020-07-09 NOTE — TOC Initial Note (Signed)
Transition of Care [facility]) - Initial/Assessment Note    Patient Details  Name: Christina Sanders MRN: [id number] Date of Birth: 05-28-1980  Transition of Care [facility]) CM/SW Contact:    Eileen Stanford, LCSW Phone Number: 07/09/2020, 1:38 PM  Clinical Narrative:       CSW spoke with pt and pt is agreeable to [facility] with no agency preference. Pt does not need any DME. CSW will search for [facility] agency that takes pt's insurance.            Expected Discharge Plan: [place] Barriers to Discharge: No Barriers Identified   Patient Goals and CMS Choice Patient states their goals for this hospitalization and ongoing recovery are:: to go home   Choice offered to / list presented to : Patient  Expected Discharge Plan and Services Expected Discharge Plan: [place] In-house Referral: NA   Post Acute Care Choice: [place] arrangements for the past 2 months: Single Family Home Expected Discharge Date: 07/09/20                         HH Arranged: RN,PT,OT [place] Agency: [place] ([place]) Date HH Agency Contacted: 07/09/20 Time [place]: [id number]    Prior Living Arrangements/Services Living arrangements for the past 2 months: [place] with:: Self Patient language and need for interpreter reviewed:: Yes        Need for Family Participation in Patient Care: Yes (Comment) Care giver support system in place?: Yes (comment)   Criminal Activity/Legal Involvement Pertinent to Current Situation/Hospitalization: No - Comment as needed  Activities of Daily Living Home Assistive Devices/Equipment: None ADL Screening (condition at time of admission) Patient's cognitive ability adequate to safely complete daily activities?: Yes Is the patient deaf or have difficulty hearing?: No Does the patient have difficulty seeing, even when wearing glasses/contacts?: No Does the patient have difficulty concentrating, remembering, or  making decisions?: No Patient able to express need for assistance with ADLs?: Yes Does the patient have difficulty dressing or bathing?: No Independently performs ADLs?: Yes (appropriate for developmental age) Does the patient have difficulty walking or climbing stairs?: No Weakness of Legs: Both Weakness of Arms/Hands: None  Permission Sought/Granted Permission sought to share information with : Family Supports Permission granted to share information with : Yes, Verbal Permission Granted  Share Information with NAME: delroy           Emotional Assessment Appearance:: Appears stated age Attitude/Demeanor/Rapport: Engaged   Orientation: : Oriented to Self,Oriented to  Time,Oriented to Situation,Oriented to Place Alcohol / Substance Use: Not Applicable Psych Involvement: No (comment)  Admission diagnosis:  Strangulated ventral hernia [K43.6] Sepsis ([place]) [A41.9] Sepsis without acute organ dysfunction, due to unspecified organism [facility]) [A41.9] Patient Active Problem List   Diagnosis Date Noted  . Strangulated ventral hernia   . Sepsis ([place]) 07/05/2020   PCP:  Janie Morning, DO Pharmacy:   [id number] [address] [place] [address] Phone: [phone] Fax: [phone]     Social Determinants of Health (SDOH) Interventions    Readmission Risk Interventions No flowsheet data found.

## 2020-07-09 NOTE — Progress Notes (Signed)
SATURATION QUALIFICATIONS: (This note is used to comply with regulatory documentation for home oxygen)  Patient Saturations on Room Air at Rest =96  Patient Saturations on Room Air while Ambulating = 87  Patient Saturations on 2 Liters of oxygen while Ambulating = 96  Please briefly explain why patient needs home oxygen: Patient unable to maintain adequate oxygenation during ambulation.

## 2020-07-09 NOTE — Discharge Summary (Signed)
EDIA PURSIFULL NWG:[id number] DOB: [birth year] DOA: 07/04/2020  PCP: Janie Morning, DO  Admit date: 07/04/2020 Discharge date: 07/09/2020  Admitted From: home Disposition:  home  Recommendations for Outpatient Follow-up:  1. Follow up with PCP in 1 week 2. Please obtain BMP/CBC in one week 3. General surgery in one week  Home Health:yes    Discharge Condition:Stable CODE STATUS:full  Diet recommendation: Heart Healthy diuretic therapy for community further Brief/Interim Summary: Per HPI: Christina Sanders is a 40 y.o. African-American female with medical history significant for non-Hodgkin lymphoma in remission for 20years, presented to the ER with acute onset of lower abdominal pain in the shoulder "she will big p.m. "squadron 6 with associated diarrhea with loose bowel movements after eating .  Upon presentation to the ER, temperature was 103.3, blood pressure was 142/72, pulse 110, respiratory rate was normal later 23 with otherwise normal vital signs. Labs revealed mild hypokalemia 3.4 leukocytosis of 15.7.  Urinalysis showed 11-20 WBCs and 11-20 squamous cells with 6-10 RBCs and positive mucus. Had CT A/P as full result below. Was found with incarcerated paraumbilical hernia.   1. Findings consistent with an incarcerated para umbilical hernia containing fat and a lobulated collection of fluid, as described above. This area of fluid is felt to less likely represent a short segment of incarcerated small bowel, however, this cannot be excluded. Surgical consultation is recommended. 2. Cholelithiasis. 3. Large pancreatic calcification with multiple smaller peripancreatic calcifications which may represent sequelae associated with chronic pancreatitis. Given the size of the larger calcification, MRI correlation is recommended to exclude an underlying neoplasm. 4. Partially calcified soft tissue mass adjacent to the mid left kidney which may represent a partially calcified lymph node. 5.  Large calcified and noncalcified uterine fibroids. 6. 2 mm nonobstructing renal stone within the left kidney. 7. Moderate to marked severity degenerative changes at the level of L5-S1.  Patient was admitted to the hospital.  General surgery was consulted.   Strangulated ventral hernia -status post repair postop day 3 General surgery followed-cleared patient for discharge Was treated with IV antibiotics Received PT Follow-up with general surgery as outpatient   SIRS  present on admission with leukocytosis and tachycardia and tachypnea: Sepsis ruled out Blood culture to date negative MRSA negative Leukocytosis has improved  SOB/PNA-found with pna on CT scan. More on RL, concern for aspiration.  Negative for PE.  Switch iv abx to zosyn, leukocytosis improved.  Afebrile. Ambulated will require 2 L of oxygen as her O2 sat dropped to 87% on room air with ambulation Encourage incentive spirometer We will discharge on p.o. antibiotics to complete course Follow-up with primary care for further management as outpatient    Hypophosphatemia: Persistent due to GI loss.   Replace   Hypertension-essential.  Started on amlodipine 5 mg daily Need to follow-up with primary care for further management  Pancreatic calcification- possibiy 2/2 sequelae associated with chronic pancreatitis found on admission CT. There is a  large size that needs MRI to exclude udnerlying neoplasm.  Discussed this with patient and recommended needs to f/u with pcp for further evaluation with MRI, she verbalizes an understanding.   Thyroid nodule on Rt lobe-needs outpatient thyroid US  D/w pt and will need to f/u with pcp   Discharge Diagnoses:  Active Problems:   Sepsis ([place])   Strangulated ventral hernia    Discharge Instructions  Discharge Instructions    Call MD for:  severe uncontrolled pain   Complete by: As directed  Call MD for:  temperature >100.4   Complete by: As directed    Call  MD for:  temperature >100.4   Complete by: As directed    Diet - low sodium heart healthy   Complete by: As directed    Diet - low sodium heart healthy   Complete by: As directed    Discharge wound care:   Complete by: As directed    As above. Pat dry when shower   Discharge wound care:   Complete by: As directed    Pat dry after shower   Increase activity slowly   Complete by: As directed    Increase activity slowly   Complete by: As directed      Allergies as of 07/09/2020      Reactions   Heparin Nausea And Vomiting      Medication List    STOP taking these medications   naproxen 500 MG tablet Commonly known as: Naprosyn     TAKE these medications   amLODipine 5 MG tablet Commonly known as: NORVASC Take 1 tablet (5 mg total) by mouth daily. Start taking on: July 10, 2020   amoxicillin-clavulanate 875-125 MG tablet Commonly known as: Augmentin Take 1 tablet by mouth 2 (two) times daily for 5 days.   traMADol 50 MG tablet Commonly known as: Ultram Take 1 tablet (50 mg total) by mouth every 6 (six) hours as needed for moderate pain.            Durable Medical Equipment  (From admission, onward)         Start     Ordered   07/09/20 1312  For home use only DME oxygen  Once       Question Answer Comment  Length of Need 6 Months   Mode or (Route) Nasal cannula   Liters per Minute 2   Frequency Continuous (stationary and portable oxygen unit needed)   Oxygen conserving device Yes   Oxygen delivery system Gas      07/09/20 1312           Discharge Care Instructions  (From admission, onward)         Start     Ordered   07/09/20 0000  Discharge wound care:       Comments: As above. Pat dry when shower   07/09/20 1056   07/09/20 0000  Discharge wound care:       Comments: Pat dry after shower   07/09/20 1058          Follow-up Information    Fredirick Maudlin, MD. Go on 08/21/2020.   Specialty: General Surgery Why: at 9:15am s/p  incarcerated vnetral hernia repair  Contact information: [address] [place] [address] [phone]        Janie Morning, DO Follow up in 1 week(s).   Specialty: Family Medicine Contact information: [address] [place] [address] [phone]              Allergies  Allergen Reactions  . Heparin Nausea And Vomiting    Consultations: surgery  Procedures/Studies: CT ANGIO CHEST PE W OR WO CONTRAST  Result Date: 07/07/2020 CLINICAL DATA:  Shortness of breath, history of ventral hernia, history of non-Hodgkin lymphoma EXAM: CT ANGIOGRAPHY CHEST WITH CONTRAST TECHNIQUE: Multidetector CT imaging of the chest was performed using the standard protocol during bolus administration of intravenous contrast. Multiplanar CT image reconstructions and MIPs were obtained to evaluate the vascular anatomy. CONTRAST:  50mL OMNIPAQUE  IOHEXOL 350 MG/ML SOLN COMPARISON:  07/07/2020 FINDINGS: Cardiovascular: This is a technically adequate evaluation of the pulmonary vasculature. There are no filling defects or pulmonary emboli. The heart is unremarkable without pericardial effusion. No evidence of thoracic aortic aneurysm or dissection. Mediastinum/Nodes: There is a 2.3 cm hypodensity right lobe thyroid. No pathologic mediastinal or hilar adenopathy. Trachea and esophagus are unremarkable. Lungs/Pleura: There is multifocal bilateral airspace disease, with the dense consolidation seen within the superior segment right lower lobe and right upper lobe. Small bilateral pleural effusions are seen, right greater than left. No pneumothorax. Central airways are patent. Upper Abdomen: Minimal free gas within the upper abdomen consistent with recent umbilical hernia repair. Large pancreatic calcifications seen on previous abdominal CT partially visualized on this study, for which MRI was previously recommended. Musculoskeletal: No acute or destructive bony lesions. Reconstructed images  demonstrate no additional findings. Biopsy-proven fibroadenoma left breast. Review of the MIP images confirms the above findings. IMPRESSION: 1. No evidence of pulmonary embolus. 2. Multifocal bilateral pneumonia, greatest in the right upper and right lower lobes. 3. Small bilateral pleural effusions, right greater than left. 4. 2.3 cm right lobe thyroid hypodensity. Recommend nonemergent outpatient thyroid US if not previously performed. (Ref: J Am Coll Radiol. 2015 Feb;12(2): 143-50). 5. Minimal pneumoperitoneum consistent with recent abdominal surgery for umbilical hernia repair. 6. Large pancreatic calcification partially visualized. Please refer to preceding CT abdomen and pelvis report. Electronically Signed   By: Randa Ngo M.D.   On: 07/07/2020 17:37   CT ABDOMEN PELVIS W CONTRAST  Result Date: 07/05/2020 CLINICAL DATA:  Abdominal pain and fever. EXAM: CT ABDOMEN AND PELVIS WITH CONTRAST TECHNIQUE: Multidetector CT imaging of the abdomen and pelvis was performed using the standard protocol following bolus administration of intravenous contrast. CONTRAST:  141mL OMNIPAQUE IOHEXOL 300 MG/ML  SOLN COMPARISON:  None. FINDINGS: Lower chest: Mild linear atelectasis is seen within the left lung base. Hepatobiliary: Mild diffuse fatty infiltration of the liver parenchyma is noted. No focal liver abnormality is seen. Numerous subcentimeter gallstones are seen within the lumen of an otherwise normal-appearing gallbladder. Pancreas: A 4.4 cm x 3.5 cm coarse calcification is seen extending superiorly from the junction of the pancreatic body and head. An adjacent 1.6 cm x 0.9 cm calcification is also noted (axial CT image 22, CT series number 2). Multiple smaller calcifications are seen adjacent to the posterior and lateral aspects of the pancreatic head. Spleen: Normal in size without focal abnormality. Adrenals/Urinary Tract: Adrenal glands are unremarkable. Kidneys are normal, without obstructing renal calculi  or hydronephrosis. A 7 mm diameter cystic appearing area is seen within the mid right kidney. A 2 mm nonobstructing renal stone is seen within the upper pole of the left kidney. A 1.7 cm x 1.1 cm partially calcified soft tissue mass is seen adjacent to the medial aspect of the mid left kidney. The urinary bladder is empty and subsequently limited in evaluation. Stomach/Bowel: Stomach is within normal limits. Appendix appears normal. No evidence of bowel dilatation. Vascular/Lymphatic: No significant vascular findings are present. Subcentimeter para-aortic lymph nodes are seen. Reproductive: An 11.6 cm x 9.9 cm x 10.1 cm partially calcified uterine fibroid is seen within the uterine fundus. Additional smaller noncalcified uterine fibroids are noted within the lower uterine segment. The bilateral adnexa are unremarkable. Other: There is a 4.6 cm x 5.5 cm x 5.7 cm left-sided para umbilical hernia. This contains fat and a 4.0 cm x 3.5 cm x 3.8 cm lobulated area of fluid attenuation. A  mild-to-moderate amount of inflammatory fat stranding is also seen. No entering or exiting bowel loops are identified. No abdominopelvic ascites. Musculoskeletal: Moderate to marked severity degenerative changes are seen at the level of L5-S1. IMPRESSION: 1. Findings consistent with an incarcerated para umbilical hernia containing fat and a lobulated collection of fluid, as described above. This area of fluid is felt to less likely represent a short segment of incarcerated small bowel, however, this cannot be excluded. Surgical consultation is recommended. 2. Cholelithiasis. 3. Large pancreatic calcification with multiple smaller peripancreatic calcifications which may represent sequelae associated with chronic pancreatitis. Given the size of the larger calcification, MRI correlation is recommended to exclude an underlying neoplasm. 4. Partially calcified soft tissue mass adjacent to the mid left kidney which may represent a partially  calcified lymph node. 5. Large calcified and noncalcified uterine fibroids. 6. 2 mm nonobstructing renal stone within the left kidney. 7. Moderate to marked severity degenerative changes at the level of L5-S1. Electronically Signed   By: Virgina Norfolk M.D.   On: 07/05/2020 00:28   DG Chest Port 1 View  Result Date: 07/07/2020 CLINICAL DATA:  Shortness of breath. EXAM: PORTABLE CHEST 1 VIEW COMPARISON:  None. FINDINGS: The heart size and mediastinal contours are within normal limits. Pulmonary vascular congestion and mild diffuse interstitial thickening. Ill-defined consolidation in the right upper lobe. No pleural effusion or pneumothorax. No acute osseous abnormality. IMPRESSION: 1. Ill-defined consolidation in the right upper lobe may reflect pneumonia or asymmetric pulmonary edema. 2. Mild pulmonary vascular congestion and interstitial edema. Electronically Signed   By: Titus Dubin M.D.   On: 07/07/2020 15:47       Subjective: Sob with ambulation but overall feeling better  Discharge Exam: Vitals:   07/09/20 0818 07/09/20 1207  BP: (!) 161/104 (!) 149/86  Pulse: 78 79  Resp: 20 18  Temp: 98.1 F (36.7 C) 98.6 F (37 C)  SpO2: 92% 94%   Vitals:   07/09/20 0352 07/09/20 0511 07/09/20 0818 07/09/20 1207  BP: (!) 165/91  (!) 161/104 (!) 149/86  Pulse: 82 84 78 79  Resp: 20  20 18   Temp: 99.1 F (37.3 C)  98.1 F (36.7 C) 98.6 F (37 C)  TempSrc: Oral  Oral Oral  SpO2: 94% 93% 92% 94%  Weight:      Height:        General: Pt is alert, awake, not in acute distress Cardiovascular: RRR, S1/S2 +, no rubs, no gallops Respiratory: CTA bilaterally, no wheezing, no rhonchi Abdominal: Soft, NT, ND, bowel sounds + Extremities: no edema    The results of significant diagnostics from this hospitalization (including imaging, microbiology, ancillary and laboratory) are listed below for reference.     Microbiology: Recent Results (from the past 240 hour(s))  Blood culture  (routine x 2)     Status: None (Preliminary result)   Collection Time: 07/04/20  8:50 PM   Specimen: Right Antecubital; Blood  Result Value Ref Range Status   Specimen Description RIGHT ANTECUBITAL  Final   Special Requests   Final    BOTTLES DRAWN AEROBIC AND ANAEROBIC Blood Culture results may not be optimal due to an inadequate volume of blood received in culture bottles   Culture   Final    NO GROWTH 4 DAYS Performed at [facility], [address] [place], [place] [address]    Report Status PENDING  Incomplete  Blood culture (routine x 2)     Status: None (Preliminary result)   Collection  Time: 07/04/20  8:52 PM   Specimen: BLOOD  Result Value Ref Range Status   Specimen Description BLOOD  RIGHT FOREARM  Final   Special Requests   Final    BOTTLES DRAWN AEROBIC AND ANAEROBIC Blood Culture adequate volume   Culture   Final    NO GROWTH 4 DAYS Performed at [facility], [address], [place], [place] [address]    Report Status PENDING  Incomplete  Resp Panel by RT-PCR (Flu A&B, Covid) Nasopharyngeal Swab     Status: None   Collection Time: 07/05/20 12:38 AM   Specimen: Nasopharyngeal Swab; Nasopharyngeal(NP) swabs in vial transport medium  Result Value Ref Range Status   SARS Coronavirus 2 by RT PCR NEGATIVE NEGATIVE Final    Comment: (NOTE) SARS-CoV-2 target nucleic acids are NOT DETECTED.  The SARS-CoV-2 RNA is generally detectable in upper respiratory specimens during the acute phase of infection. The lowest concentration of SARS-CoV-2 viral copies this assay can detect is 138 copies/mL. A negative result does not preclude SARS-Cov-2 infection and should not be used as the sole basis for treatment or other patient management decisions. A negative result may occur with  improper specimen collection/handling, submission of specimen other than nasopharyngeal swab, presence of viral mutation(s) within the areas targeted by this assay, and inadequate  number of viral copies(<138 copies/mL). A negative result must be combined with clinical observations, patient history, and epidemiological information. The expected result is Negative.  Fact Sheet for Patients:  EntrepreneurPulse.com.au  Fact Sheet for Healthcare Providers:  IncredibleEmployment.be  This test is no t yet approved or cleared by the [place] FDA and  has been authorized for detection and/or diagnosis of SARS-CoV-2 by FDA under an Emergency Use Authorization (EUA). This EUA will remain  in effect (meaning this test can be used) for the duration of the COVID-19 declaration under Section 564(b)(1) of the Act, 21 U.S.C.section 360bbb-3(b)(1), unless the authorization is terminated  or revoked sooner.       Influenza A by PCR NEGATIVE NEGATIVE Final   Influenza B by PCR NEGATIVE NEGATIVE Final    Comment: (NOTE) The Xpert Xpress SARS-CoV-2/FLU/RSV plus assay is intended as an aid in the diagnosis of influenza from Nasopharyngeal swab specimens and should not be used as a sole basis for treatment. Nasal washings and aspirates are unacceptable for Xpert Xpress SARS-CoV-2/FLU/RSV testing.  Fact Sheet for Patients: EntrepreneurPulse.com.au  Fact Sheet for Healthcare Providers: IncredibleEmployment.be  This test is not yet approved or cleared by the [place] FDA and has been authorized for detection and/or diagnosis of SARS-CoV-2 by FDA under an Emergency Use Authorization (EUA). This EUA will remain in effect (meaning this test can be used) for the duration of the COVID-19 declaration under Section 564(b)(1) of the Act, 21 U.S.C. section 360bbb-3(b)(1), unless the authorization is terminated or revoked.  Performed at [facility], [address] [place], [place] [address]   MRSA PCR Screening     Status: None   Collection Time: 07/07/20  9:05 PM   Specimen: Nasal Mucosa;  Nasopharyngeal  Result Value Ref Range Status   MRSA by PCR NEGATIVE NEGATIVE Final    Comment:        The GeneXpert MRSA Assay (FDA approved for NASAL specimens only), is one component of a comprehensive MRSA colonization surveillance program. It is not intended to diagnose MRSA infection nor to guide or monitor treatment for MRSA infections. Performed at [facility], [address] [place], [place] [address]  Labs: BNP (last 3 results) Recent Labs    07/07/20 2017  BNP 33.8   Basic Metabolic Panel: Recent Labs  Lab 07/04/20 2049 07/05/20 0210 07/06/20 0013 07/07/20 0521 07/08/20 0425  NA 137 136 136 138 140  K 3.4* 3.3* 3.4* 3.9 3.5  CL 103 103 108 111 109  CO2 23 24 18* 20* 23  GLUCOSE 111* 111* 121* 140* 127*  BUN 12 9 9 11 10   CREATININE 0.84 0.85 0.83 0.71 0.64  CALCIUM 8.9 8.2* 7.9* 7.8* 8.1*  MG  --   --  1.7 2.1 2.0  PHOS  --   --  <1.0* 1.3* 2.4*   Liver Function Tests: Recent Labs  Lab 07/04/20 2049 07/05/20 0210  AST 20 19  ALT 18 17  ALKPHOS 62 53  BILITOT 0.7 0.7  PROT 8.8* 8.0  ALBUMIN 3.9 3.4*   Recent Labs  Lab 07/04/20 2049  LIPASE 34   No results for input(s): AMMONIA in the last 168 hours. CBC: Recent Labs  Lab 07/05/20 0210 07/06/20 0013 07/07/20 0521 07/08/20 0425 07/09/20 0601  WBC 18.6* 12.6* 14.9* 14.8* 11.6*  NEUTROABS 17.5* 11.4* 13.0* 12.0*  --   HGB 10.9* 10.3* 10.1* 10.3* 10.3*  HCT 33.3* 31.8* 30.8* 32.6* 31.8*  MCV 79.7* 79.9* 79.4* 80.5 77.9*  PLT 263 234 244 274 256   Cardiac Enzymes: No results for input(s): CKTOTAL, CKMB, CKMBINDEX, TROPONINI in the last 168 hours. BNP: Invalid input(s): POCBNP CBG: No results for input(s): GLUCAP in the last 168 hours. D-Dimer No results for input(s): DDIMER in the last 72 hours. Hgb A1c No results for input(s): HGBA1C in the last 72 hours. Lipid Profile No results for input(s): CHOL, HDL, LDLCALC, TRIG, CHOLHDL, LDLDIRECT in the last 72  hours. Thyroid function studies No results for input(s): TSH, T4TOTAL, T3FREE, THYROIDAB in the last 72 hours.  Invalid input(s): FREET3 Anemia work up No results for input(s): VITAMINB12, FOLATE, FERRITIN, TIBC, IRON, RETICCTPCT in the last 72 hours. Urinalysis    Component Value Date/Time   COLORURINE YELLOW (A) 07/05/2020 0012   APPEARANCEUR CLOUDY (A) 07/05/2020 0012   LABSPEC 1.017 07/05/2020 0012   PHURINE 6.0 07/05/2020 0012   GLUCOSEU NEGATIVE 07/05/2020 0012   HGBUR MODERATE (A) 07/05/2020 0012   BILIRUBINUR NEGATIVE 07/05/2020 0012   KETONESUR NEGATIVE 07/05/2020 0012   PROTEINUR 30 (A) 07/05/2020 0012   NITRITE NEGATIVE 07/05/2020 0012   LEUKOCYTESUR MODERATE (A) 07/05/2020 0012   Sepsis Labs Invalid input(s): PROCALCITONIN,  WBC,  LACTICIDVEN Microbiology Recent Results (from the past 240 hour(s))  Blood culture (routine x 2)     Status: None (Preliminary result)   Collection Time: 07/04/20  8:50 PM   Specimen: Right Antecubital; Blood  Result Value Ref Range Status   Specimen Description RIGHT ANTECUBITAL  Final   Special Requests   Final    BOTTLES DRAWN AEROBIC AND ANAEROBIC Blood Culture results may not be optimal due to an inadequate volume of blood received in culture bottles   Culture   Final    NO GROWTH 4 DAYS Performed at [facility], [address] [place], [place] [address]    Report Status PENDING  Incomplete  Blood culture (routine x 2)     Status: None (Preliminary result)   Collection Time: 07/04/20  8:52 PM   Specimen: BLOOD  Result Value Ref Range Status   Specimen Description BLOOD  RIGHT FOREARM  Final   Special Requests   Final    BOTTLES  DRAWN AEROBIC AND ANAEROBIC Blood Culture adequate volume   Culture   Final    NO GROWTH 4 DAYS Performed at [facility], [address] [place], [place] [address]    Report Status PENDING  Incomplete  Resp Panel by RT-PCR (Flu A&B, Covid) Nasopharyngeal Swab     Status: None    Collection Time: 07/05/20 12:38 AM   Specimen: Nasopharyngeal Swab; Nasopharyngeal(NP) swabs in vial transport medium  Result Value Ref Range Status   SARS Coronavirus 2 by RT PCR NEGATIVE NEGATIVE Final    Comment: (NOTE) SARS-CoV-2 target nucleic acids are NOT DETECTED.  The SARS-CoV-2 RNA is generally detectable in upper respiratory specimens during the acute phase of infection. The lowest concentration of SARS-CoV-2 viral copies this assay can detect is 138 copies/mL. A negative result does not preclude SARS-Cov-2 infection and should not be used as the sole basis for treatment or other patient management decisions. A negative result may occur with  improper specimen collection/handling, submission of specimen other than nasopharyngeal swab, presence of viral mutation(s) within the areas targeted by this assay, and inadequate number of viral copies(<138 copies/mL). A negative result must be combined with clinical observations, patient history, and epidemiological information. The expected result is Negative.  Fact Sheet for Patients:  EntrepreneurPulse.com.au  Fact Sheet for Healthcare Providers:  IncredibleEmployment.be  This test is no t yet approved or cleared by the [place] FDA and  has been authorized for detection and/or diagnosis of SARS-CoV-2 by FDA under an Emergency Use Authorization (EUA). This EUA will remain  in effect (meaning this test can be used) for the duration of the COVID-19 declaration under Section 564(b)(1) of the Act, 21 U.S.C.section 360bbb-3(b)(1), unless the authorization is terminated  or revoked sooner.       Influenza A by PCR NEGATIVE NEGATIVE Final   Influenza B by PCR NEGATIVE NEGATIVE Final    Comment: (NOTE) The Xpert Xpress SARS-CoV-2/FLU/RSV plus assay is intended as an aid in the diagnosis of influenza from Nasopharyngeal swab specimens and should not be used as a sole basis for treatment.  Nasal washings and aspirates are unacceptable for Xpert Xpress SARS-CoV-2/FLU/RSV testing.  Fact Sheet for Patients: EntrepreneurPulse.com.au  Fact Sheet for Healthcare Providers: IncredibleEmployment.be  This test is not yet approved or cleared by the [place] FDA and has been authorized for detection and/or diagnosis of SARS-CoV-2 by FDA under an Emergency Use Authorization (EUA). This EUA will remain in effect (meaning this test can be used) for the duration of the COVID-19 declaration under Section 564(b)(1) of the Act, 21 U.S.C. section 360bbb-3(b)(1), unless the authorization is terminated or revoked.  Performed at [facility], [address] [place], [place] [address]   MRSA PCR Screening     Status: None   Collection Time: 07/07/20  9:05 PM   Specimen: Nasal Mucosa; Nasopharyngeal  Result Value Ref Range Status   MRSA by PCR NEGATIVE NEGATIVE Final    Comment:        The GeneXpert MRSA Assay (FDA approved for NASAL specimens only), is one component of a comprehensive MRSA colonization surveillance program. It is not intended to diagnose MRSA infection nor to guide or monitor treatment for MRSA infections. Performed at [facility], [address] [place], [place] [address]      Time coordinating discharge: Over 30 minutes  SIGNED:   Nolberto Hanlon, MD  [facility] 07/09/2020, 1:17 PM Pager   If 7PM-7AM, please contact night-coverage [email or web address] Password [email or web address]

## 2020-07-09 NOTE — TOC Transition Note (Signed)
Transition of Care [facility]) - CM/SW Discharge Note   Patient Details  Name: Christina Sanders MRN: [id number] Date of Birth: [birth year]  [facility]) CM/SW Contact:  Eileen Stanford, LCSW Phone Number: 07/09/2020, 1:53 PM   Clinical Narrative:  [facility] will provide 02 at bedside. HH arranged through [facility]. No additional needs.     Final next level of care: Home w Home Health Services Barriers to Discharge: No Barriers Identified   Patient Goals and CMS Choice Patient states their goals for this hospitalization and ongoing recovery are:: to go home   Choice offered to / list presented to : Patient  Discharge Placement                    Patient and family notified of of transfer: 07/09/20  Discharge Plan and Services In-house Referral: NA   Post Acute Care Choice: Home Health          DME Arranged: [facility] DME Agency: [facility] Date DME Agency Contacted: 07/09/20 Time DME Agency Contacted: [id number] Representative spoke with at DME Agency: [place]: PT,OT [place] Agency: [facility] [place] Date [place]: 07/09/20 Time [place]: 1352 Representative spoke with at [place]: brittany  Social Determinants of Health ([place]) Interventions     Readmission Risk Interventions No flowsheet data found.

## 2020-07-09 NOTE — TOC Progression Note (Signed)
Transition of Care [facility]) - Progression Note    Patient Details  Name: Christina Sanders MRN: [id number] Date of Birth: [birth year]  Transition of Care [facility]) CM/SW Contact  Eileen Stanford, LCSW Phone Number: 07/09/2020, 1:50 PM  Clinical Narrative:   [facility] will service pt with [place] PT and OT.    Expected Discharge Plan: [place] Barriers to Discharge: No Barriers Identified  Expected Discharge Plan and Services Expected Discharge Plan: [place] In-house Referral: NA   Post Acute Care Choice: [place] arrangements for the past 2 months: Single Family Home Expected Discharge Date: 07/09/20                         HH Arranged: RN,PT,OT [place] Agency: [place] ([place]) Date HH Agency Contacted: 07/09/20 Time [place]: [id number]     Social Determinants of Health (SDOH) Interventions    Readmission Risk Interventions No flowsheet data found.

## 2020-07-09 NOTE — Plan of Care (Addendum)
Pt Axox4. Calm and cooperative and able to voice her needs. Prn pain meds administered this am for abdominal pain, effective. Pt encouraged to use abdominal binder. Pt able to ambulate in her room back and forth from bed to door 5 times. Pt on RA since this am and O2 remained above 92% on RA while ambulating. Pt on IV Abx. Pt using her Incentive spirometer religiously. Occasional productive coughs noted blood tinged at times, white sputum. Pt states feel much better this am. Prn Labetalol administered once last evening, effective. Safety measures in place. Will continue to monitor.   Problem: Education: Goal: Knowledge of General Education information will improve Description: Including pain rating scale, medication(s)/side effects and non-pharmacologic comfort measures Outcome: Progressing   Problem: Health Behavior/Discharge Planning: Goal: Ability to manage health-related needs will improve Outcome: Progressing   Problem: Clinical Measurements: Goal: Ability to maintain clinical measurements within normal limits will improve Outcome: Progressing Goal: Will remain free from infection Outcome: Progressing Goal: Diagnostic test results will improve Outcome: Progressing Goal: Respiratory complications will improve Outcome: Progressing Goal: Cardiovascular complication will be avoided Outcome: Progressing   Problem: Activity: Goal: Risk for activity intolerance will decrease Outcome: Progressing   Problem: Nutrition: Goal: Adequate nutrition will be maintained Outcome: Progressing   Problem: Coping: Goal: Level of anxiety will decrease Outcome: Progressing   Problem: Elimination: Goal: Will not experience complications related to bowel motility Outcome: Progressing Goal: Will not experience complications related to urinary retention Outcome: Progressing   Problem: Pain Managment: Goal: General experience of comfort will improve Outcome: Progressing   Problem: Safety: Goal:  Ability to remain free from injury will improve Outcome: Progressing   Problem: Skin Integrity: Goal: Risk for impaired skin integrity will decrease Outcome: Progressing

## 2020-07-09 NOTE — Discharge Instructions (Signed)
In addition to included general post-operative instructions,  Diet: Resume home diet.   Activity: No heavy lifting >20 pounds (children, pets, laundry, garbage) for 6 weeks, but light activity and walking are encouraged. Do not drive or drink alcohol if taking narcotic pain medications or having pain that might distract from driving.  Wound care: You may shower/get incision wet with soapy water and pat dry (do not rub incisions), but no baths or submerging incision underwater until follow-up. You have steri-strips on your incision. These will likely remain in place until your follow up appointment or will begin to fall off in 10-14 days.   Medications: Resume all home medications. For mild to moderate pain: acetaminophen (Tylenol) or ibuprofen/naproxen (if no kidney disease). Combining Tylenol with alcohol can substantially increase your risk of causing liver disease. Narcotic pain medications, if prescribed, can be used for severe pain, though may cause nausea, constipation, and drowsiness. Do not combine Tylenol and Percocet (or similar) within a 6 hour period as Percocet (and similar) contain(s) Tylenol. If you do not need the narcotic pain medication, you do not need to fill the prescription.  Call office [phone] / [phone]) at any time if any questions, worsening pain, fevers/chills, bleeding, drainage from incision site, or other concerns.

## 2020-07-10 LAB — CULTURE, BLOOD (ROUTINE X 2)
Culture: NO GROWTH
Culture: NO GROWTH
Special Requests: ADEQUATE

## 2020-07-22 ENCOUNTER — Other Ambulatory Visit: Payer: Self-pay

## 2020-07-22 ENCOUNTER — Encounter: Payer: Self-pay | Admitting: General Surgery

## 2020-07-22 ENCOUNTER — Ambulatory Visit (INDEPENDENT_AMBULATORY_CARE_PROVIDER_SITE_OTHER): Payer: BC Managed Care – PPO | Admitting: General Surgery

## 2020-07-22 VITALS — BP 146/102 | HR 80 | Temp 98.1°F | Ht 64.0 in | Wt 302.2 lb

## 2020-07-22 DIAGNOSIS — K436 Other and unspecified ventral hernia with obstruction, without gangrene: Secondary | ICD-10-CM

## 2020-07-22 DIAGNOSIS — I10 Essential (primary) hypertension: Secondary | ICD-10-CM | POA: Insufficient documentation

## 2020-07-22 DIAGNOSIS — Z8639 Personal history of other endocrine, nutritional and metabolic disease: Secondary | ICD-10-CM | POA: Insufficient documentation

## 2020-07-22 DIAGNOSIS — Z8579 Personal history of other malignant neoplasms of lymphoid, hematopoietic and related tissues: Secondary | ICD-10-CM | POA: Insufficient documentation

## 2020-07-22 DIAGNOSIS — Z6841 Body Mass Index (BMI) 40.0 and over, adult: Secondary | ICD-10-CM | POA: Insufficient documentation

## 2020-07-22 DIAGNOSIS — K8020 Calculus of gallbladder without cholecystitis without obstruction: Secondary | ICD-10-CM | POA: Insufficient documentation

## 2020-07-22 NOTE — Patient Instructions (Addendum)
Please call if you have any questions or concerns. Place a small amount of Triple Antibiotic ointment twice daily and place a dry dressing over the area.   GENERAL POST-OPERATIVE PATIENT INSTRUCTIONS   WOUND CARE INSTRUCTIONS:  Keep a dry clean dressing on the wound if there is drainage. The initial bandage may be removed after 24 hours.  Once the wound has quit draining you may leave it open to air.  If clothing rubs against the wound or causes irritation and the wound is not draining you may cover it with a dry dressing during the daytime.  Try to keep the wound dry and avoid ointments on the wound unless directed to do so.  If the wound becomes bright red and painful or starts to drain infected material that is not clear, please contact your physician immediately.  If the wound is mildly pink and has a thick firm ridge underneath it, this is normal, and is referred to as a healing ridge.  This will resolve over the next 4-6 weeks.  BATHING: You may shower if you have been informed of this by your surgeon. However, Please do not submerge in a tub, hot tub, or pool until incisions are completely sealed or have been told by your surgeon that you may do so.  DIET:  You may eat any foods that you can tolerate.  It is a good idea to eat a high fiber diet and take in plenty of fluids to prevent constipation.  If you do become constipated you may want to take a mild laxative or take ducolax tablets on a daily basis until your bowel habits are regular.  Constipation can be very uncomfortable, along with straining, after recent surgery.  ACTIVITY:  You are encouraged to cough and deep breath or use your incentive spirometer if you were given one, every 15-30 minutes when awake.  This will help prevent respiratory complications and low grade fevers post-operatively if you had a general anesthetic.  You may want to hug a pillow when coughing and sneezing to add additional support to the surgical area, if you had  abdominal or chest surgery, which will decrease pain during these times.  You are encouraged to walk and engage in light activity for the next two weeks.  You should not lift more than 10 pounds, until 08/17/2020  as it could put you at increased risk for complications.  Twenty pounds is roughly equivalent to a plastic bag of groceries. At that time- Listen to your body when lifting, if you have pain when lifting, stop and then try again in a few days. Soreness after doing exercises or activities of daily living is normal as you get back in to your normal routine.  MEDICATIONS:  Try to take narcotic medications and anti-inflammatory medications, such as tylenol, ibuprofen, naprosyn, etc., with food.  This will minimize stomach upset from the medication.  Should you develop nausea and vomiting from the pain medication, or develop a rash, please discontinue the medication and contact your physician.  You should not drive, make important decisions, or operate machinery when taking narcotic pain medication.  SUNBLOCK Use sun block to incision area over the next year if this area will be exposed to sun. This helps decrease scarring and will allow you avoid a permanent darkened area over your incision.  QUESTIONS:  Please feel free to call our office if you have any questions, and we will be glad to assist you. [phone]

## 2020-07-22 NOTE — Progress Notes (Signed)
Christina Sanders is here today for a postoperative visit.  She is a 40 year old woman who presented to the hospital with other medical concerns, but was also found to have a strangulated incisional hernia.  She underwent urgent repair, but secondary to extensive intra-abdominal adhesions, I was unable to get windows to place robotic trochars and performed the operation open with her primary suture repair.  Her other medical concerns kept her in the hospital for some additional days, but ultimately she was discharged.  She reports today that she is having issues with her blood pressure and is being followed by her primary care doctor for management.  She had some other concerns about her hospitalization, but these were not pertinent to the repair of her hernia.  She continues to have some discomfort in the area.  Denies any fevers or chills.  No nausea or vomiting.  She is eating well and having normal bowel movements.  She denies any drainage from the incision.  Today's Vitals   07/22/20 0946  BP: (!) 146/102  Pulse: 80  Temp: 98.1 F (36.7 C)  TempSrc: Oral  SpO2: 98%  Weight: (!) 302 lb 3.2 oz (137.1 kg)  Height: 5\' 4"  (1.626 m)   Body mass index is 51.87 kg/m. Focused examination of the surgical sites demonstrates that there is a small seroma in the space left by the hernia reduction.  There is a slight skin separation along the medial aspect of the incision.  There is no erythema, induration, or drainage present.  Impression and plan: This is a 40 year old morbidly obese woman who had a prior history of laparotomy for lymphoma.  She developed a hernia in the umbilical portion of her laparotomy incision which ultimately became strangulated and required urgent repair.  She has done well from that standpoint, though she continues to have other issues from her hospitalization that are being addressed by other providers.  I cautioned her that due to her extremely high BMI, this hernia has a very high  likelihood of recurring, but that a more durable repair could be considered in the future.  She would need to reduce her BMI significantly, however, before an elective repair would be appropriate.  I recommended that she apply a thin layer of Neosporin to the open skin site.  I reassured her that this seroma will ultimately resolve with time.  She should continue to follow with her primary care provider for her other medical needs and I will see her as needed.

## 2020-12-24 ENCOUNTER — Encounter: Payer: Self-pay | Admitting: General Surgery

## 2021-10-03 IMAGING — MG DIGITAL SCREENING BILAT W/ TOMO W/ CAD
8 series · 8 of 24 positions shown · non-contrast
Comparison: None.

CLINICAL DATA: Screening.

EXAM:
DIGITAL SCREENING BILATERAL MAMMOGRAM WITH TOMO AND CAD

[R CC synth-2D]
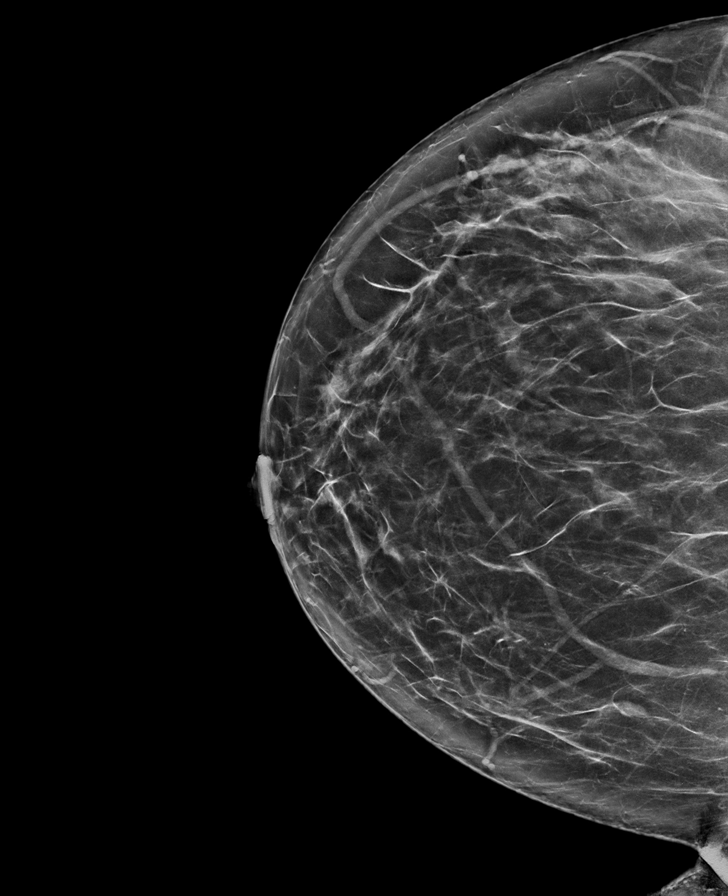

[R MLO synth-2D]
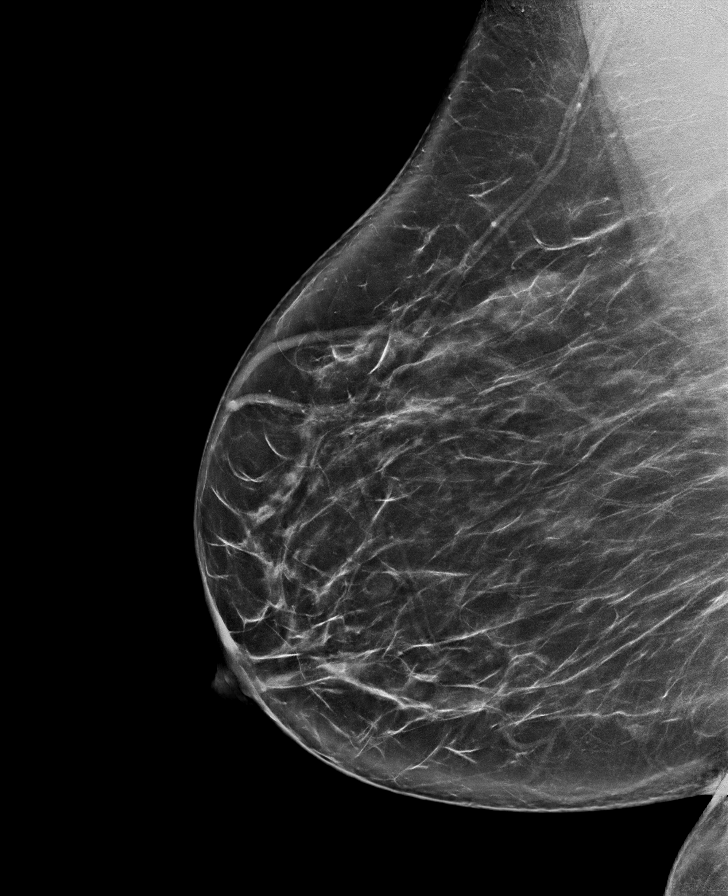

[L CC synth-2D]
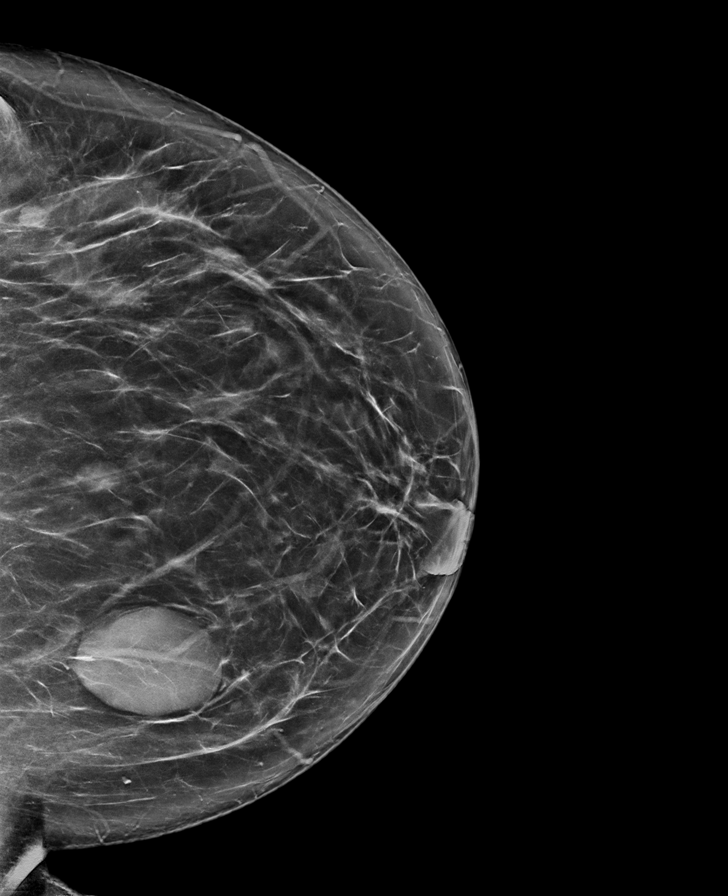

[L MLO synth-2D]
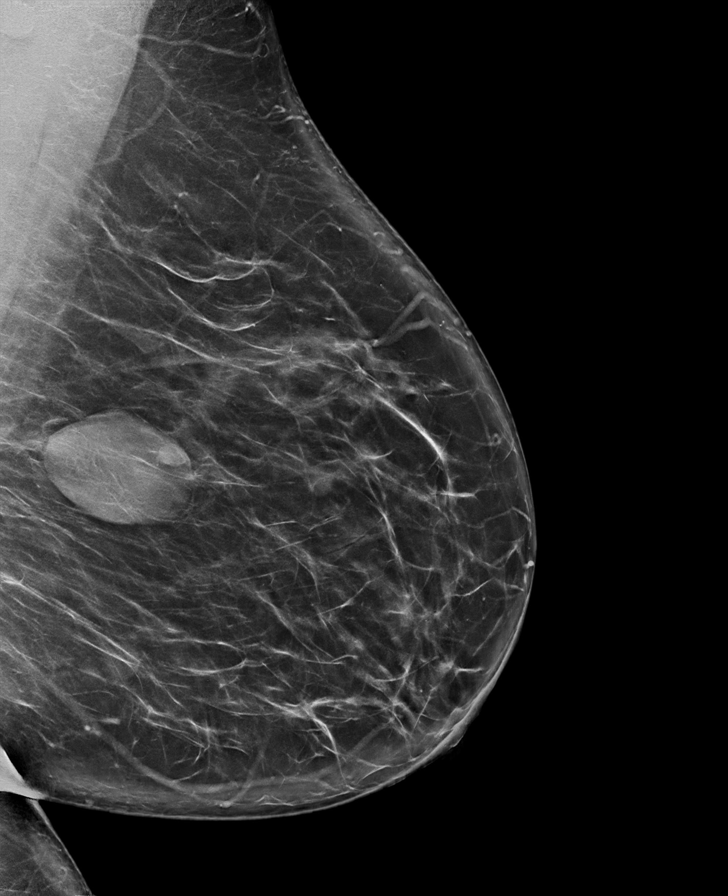

[R CC tomo · tomo slice 43/85.0]
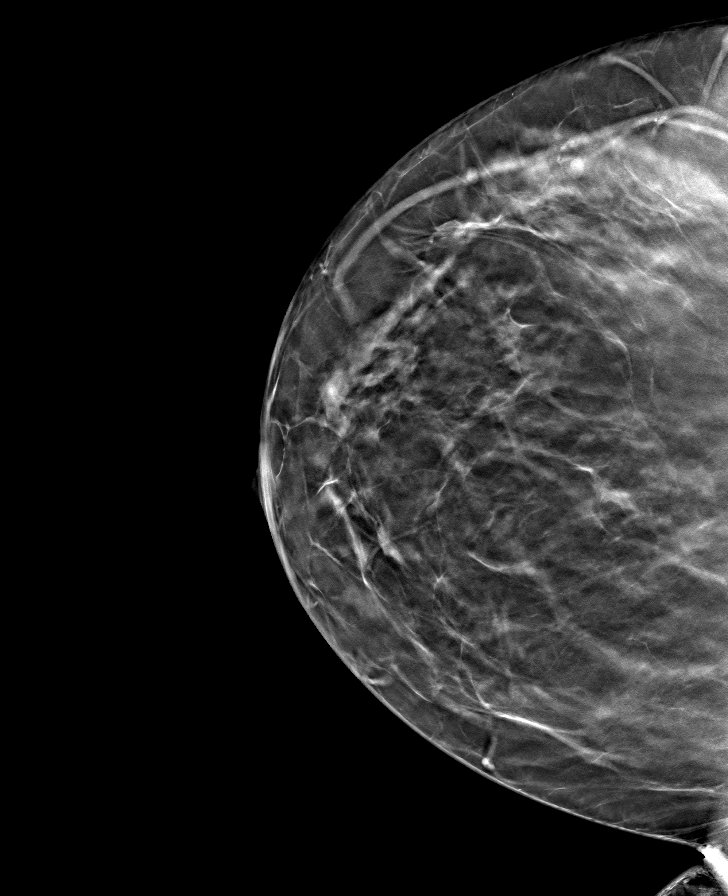

[L MLO tomo · tomo slice 51/100.0]
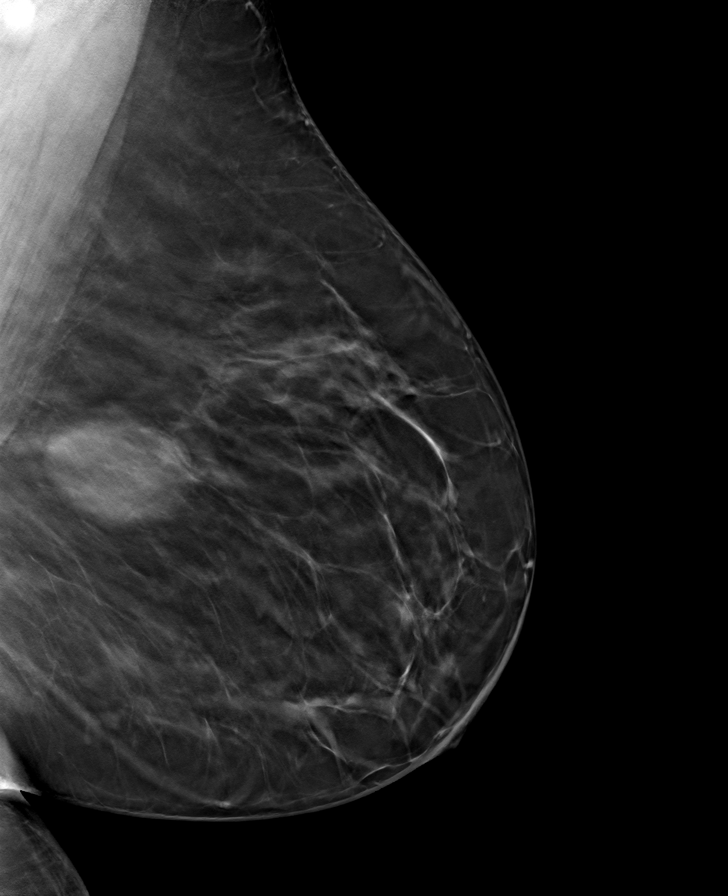

[R MLO tomo · tomo slice 49/98.0]
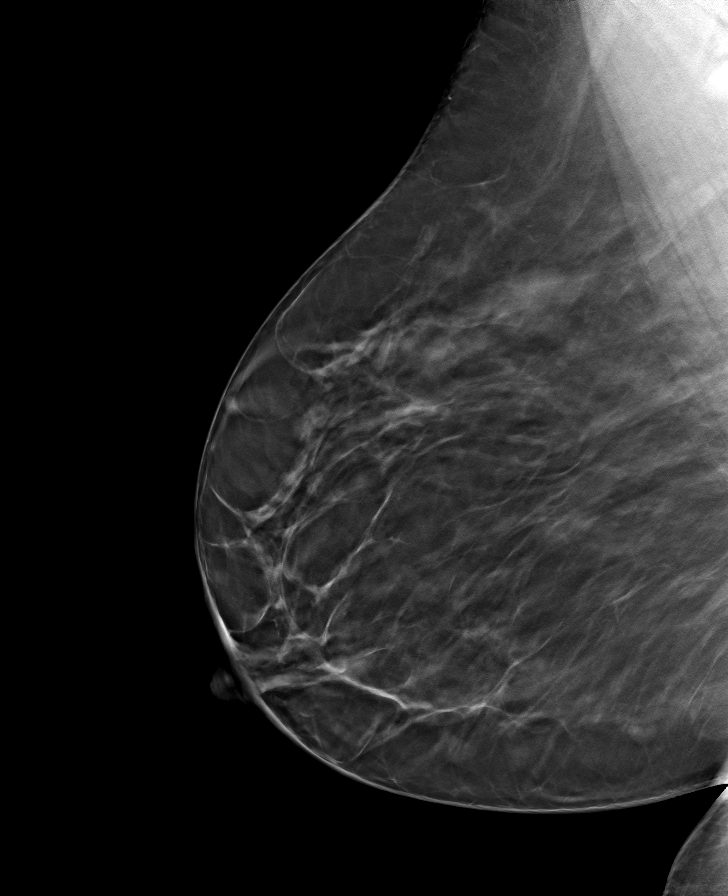

[L CC tomo · tomo slice 45/89.0]
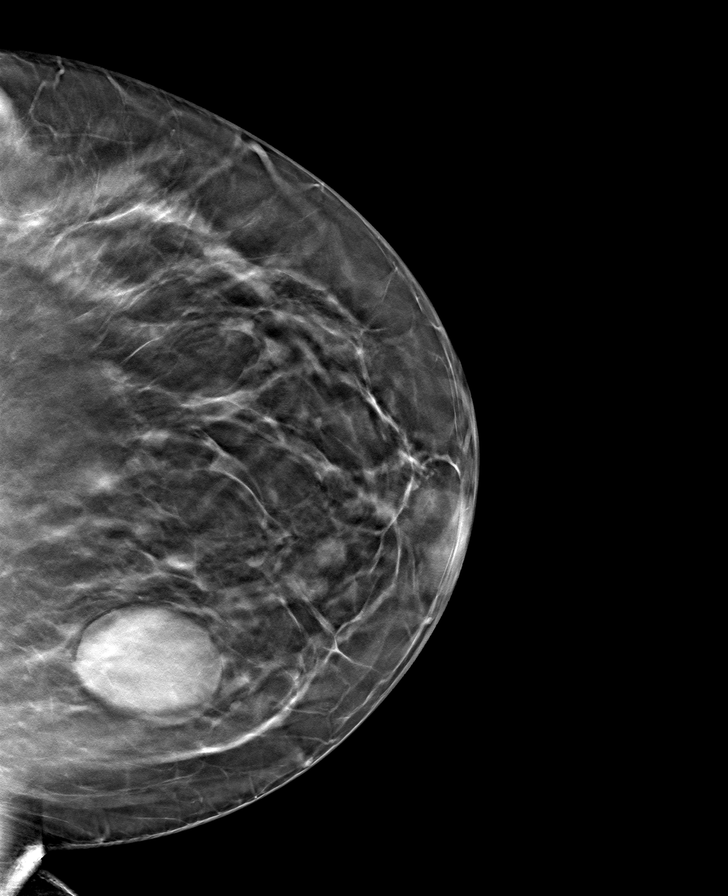

[8 of 24 positions shown; findings below may reference images not displayed]

ACR Breast Density Category b: There are scattered areas of
fibroglandular density.
FINDINGS: In the right breast mass requires further evaluation.

In the left breast masses require further evaluation.

Images were processed with CAD.
IMPRESSION: Further evaluation is suggested for possible mass in the right
breast.

Further evaluation is suggested for possible masses in the left
breast.

RECOMMENDATION:
Diagnostic mammogram and possibly ultrasound of both breasts.
(Code:[id number]-R-44M)

BI-RADS CATEGORY  0: Incomplete. Need additional imaging evaluation
and/or prior mammograms for comparison.

## 2021-12-21 ENCOUNTER — Emergency Department: Payer: BC Managed Care – PPO

## 2021-12-21 ENCOUNTER — Other Ambulatory Visit: Payer: Self-pay

## 2021-12-21 ENCOUNTER — Emergency Department
Admission: EM | Admit: 2021-12-21 | Discharge: 2021-12-21 | Disposition: A | Discharge status: Home / Self Care | Payer: BC Managed Care – PPO | Attending: Emergency Medicine | Admitting: Emergency Medicine

## 2021-12-21 ENCOUNTER — Encounter: Payer: Self-pay | Admitting: Emergency Medicine

## 2021-12-21 DIAGNOSIS — K439 Ventral hernia without obstruction or gangrene: Secondary | ICD-10-CM

## 2021-12-21 DIAGNOSIS — I10 Essential (primary) hypertension: Secondary | ICD-10-CM | POA: Insufficient documentation

## 2021-12-21 DIAGNOSIS — R109 Unspecified abdominal pain: Secondary | ICD-10-CM | POA: Diagnosis present

## 2021-12-21 LAB — COMPREHENSIVE METABOLIC PANEL
ALT: 13 U/L (ref 0–44)
AST: 14 U/L — ABNORMAL LOW (ref 15–41)
Albumin: 3.5 g/dL (ref 3.5–5.0)
Alkaline Phosphatase: 57 U/L (ref 38–126)
Anion gap: 8 (ref 5–15)
BUN: 9 mg/dL (ref 6–20)
CO2: 25 mmol/L (ref 22–32)
Calcium: 8.7 mg/dL — ABNORMAL LOW (ref 8.9–10.3)
Chloride: 106 mmol/L (ref 98–111)
Creatinine, Ser: 0.56 mg/dL (ref 0.44–1.00)
GFR, Estimated: >60 mL/min (ref >60)
Glucose, Bld: 90 mg/dL (ref 70–99)
Potassium: 3.3 mmol/L — ABNORMAL LOW (ref 3.5–5.1)
Sodium: 139 mmol/L (ref 135–145)
Total Bilirubin: 0.7 mg/dL (ref 0.3–1.2)
Total Protein: 8.4 g/dL — ABNORMAL HIGH (ref 6.5–8.1)

## 2021-12-21 LAB — LACTIC ACID, PLASMA: Lactic Acid, Venous: 1 mmol/L (ref 0.5–1.9)

## 2021-12-21 LAB — CBC
HCT: 35.9 % — ABNORMAL LOW (ref 36.0–46.0)
Hemoglobin: 11.4 g/dL — ABNORMAL LOW (ref 12.0–15.0)
MCH: 25.4 pg — ABNORMAL LOW (ref 26.0–34.0)
MCHC: 31.8 g/dL (ref 30.0–36.0)
MCV: 80 fL (ref 80.0–100.0)
Platelets: 323 10*3/uL (ref 150–400)
RBC: 4.49 MIL/uL (ref 3.87–5.11)
RDW: 15.9 % — ABNORMAL HIGH (ref 11.5–15.5)
WBC: 5.9 10*3/uL (ref 4.0–10.5)
nRBC: 0 % (ref 0.0–0.2)

## 2021-12-21 LAB — URINALYSIS, ROUTINE W REFLEX MICROSCOPIC
Bilirubin Urine: NEGATIVE
Glucose, UA: NEGATIVE mg/dL
Hgb urine dipstick: NEGATIVE
Ketones, ur: NEGATIVE mg/dL
Leukocytes,Ua: NEGATIVE
Nitrite: NEGATIVE
Protein, ur: >300 mg/dL — AB
Specific Gravity, Urine: 1.02 (ref 1.005–1.030)
pH: 6.5 (ref 5.0–8.0)

## 2021-12-21 LAB — LIPASE, BLOOD: Lipase: 33 U/L (ref 11–51)

## 2021-12-21 LAB — URINALYSIS, MICROSCOPIC (REFLEX): Bacteria, UA: NONE SEEN

## 2021-12-21 LAB — POC URINE PREG, ED: Preg Test, Ur: NEGATIVE

## 2021-12-21 MED ORDER — IOHEXOL 300 MG/ML  SOLN
100.0000 mL | Freq: Once | INTRAMUSCULAR | Status: AC | PRN
  Administered 2021-12-21: 100 mL via INTRAVENOUS

## 2021-12-21 NOTE — ED Triage Notes (Signed)
Pt via POV from home. Pt c/o umbilical abd pain. States that she had hernia repair last year and this feels similar but states this time she has had vomiting for the past week. Also endorses fevers and chills. Pt is A&Ox4 and NAD

## 2021-12-21 NOTE — ED Provider Notes (Signed)
[facility] Provider Note    Event Date/Time   First MD Initiated Contact with Patient 12/21/21 [phone]     (approximate)   History   Abdominal Pain   HPI  Christina Sanders is a 41 y.o. female with history of non-Hodgkin's lymphoma, hernia repair, and hypertension presents emergency department complaining of abdominal pain.  Patient states the area for her hernias has turned dark and is very painful.  She was able to have a bowel movement yesterday.  States she feels like she has fever and chills.  Has had nausea consistently.  Denies chest pain or shortness of breath but does endorse having some fluid retention in her legs.  Patient has history of non-Hodgkin's lymphoma and has a tumor that has been in her abdomen for 20 years.      Physical Exam   Triage Vital Signs: ED Triage Vitals  Enc Vitals Group     BP 12/21/21 0820 (!) 183/105     Pulse Rate 12/21/21 0820 74     Resp 12/21/21 0820 20     Temp 12/21/21 0820 98.4 F (36.9 C)     Temp Source 12/21/21 0820 Oral     SpO2 12/21/21 0820 100 %     Weight 12/21/21 0810 300 lb (136.1 kg)     Height 12/21/21 0810 '5\' 2"'$  (1.575 m)     Head Circumference --      Peak Flow --      Pain Score 12/21/21 0810 8     Pain Loc --      Pain Edu? --      Excl. in [place]? --     Most recent vital signs: Vitals:   12/21/21 0820 12/21/21 1126  BP: (!) 183/105 (!) 170/99  Pulse: 74 70  Resp: 20 20  Temp: 98.4 F (36.9 C) 98 F (36.7 C)  SpO2: 100% 100%     General: Awake, no distress.   CV:  Good peripheral perfusion. regular rate and  rhythm Resp:  Normal effort. Lungs CTA Abd:  No distention.  Surgical scars present, darkened ventral hernia, area is very tender to palpation, abdomen is soft, bowel sounds normal Other:      ED Results / Procedures / Treatments   Labs (all labs ordered are listed, but only abnormal results are displayed) Labs Reviewed  COMPREHENSIVE METABOLIC PANEL - Abnormal; Notable  for the following components:      Result Value   Potassium 3.3 (*)    Calcium 8.7 (*)    Total Protein 8.4 (*)    AST 14 (*)    All other components within normal limits  CBC - Abnormal; Notable for the following components:   Hemoglobin 11.4 (*)    HCT 35.9 (*)    MCH 25.4 (*)    RDW 15.9 (*)    All other components within normal limits  URINALYSIS, ROUTINE W REFLEX MICROSCOPIC - Abnormal; Notable for the following components:   Protein, ur >300 (*)    All other components within normal limits  LIPASE, BLOOD  LACTIC ACID, PLASMA  URINALYSIS, MICROSCOPIC (REFLEX)  POC URINE PREG, ED     EKG     RADIOLOGY CT abdomen/pelvis IV contrast    PROCEDURES:   Procedures   MEDICATIONS ORDERED IN ED: Medications  iohexol (OMNIPAQUE) 300 MG/ML solution 100 mL (100 mLs Intravenous Contrast Given 12/21/21 1046)     IMPRESSION / MDM / ASSESSMENT AND PLAN / ED COURSE  I reviewed the triage vital signs and the nursing notes.                              Differential diagnosis includes, but is not limited to, strangulated/incarcerated hernia, diverticulitis, acute cholecystitis, pancreatitis, recurrent lymphoma  Patient's presentation is most consistent with acute presentation with potential threat to life or bodily function.   The patient has not felt well for 1 week so we will do lactic as she has history of sepsis.  CBC metabolic panel lipase also ordered, urinalysis ordered, POC pregnancy  Patient CBC is reassuring although the WBC is a little low at 5.9 per her WBC is usually around 11.6.  Comprehensive metabolic panel has decreased potassium of 3.3 while the remainder of the metabolic panel is in the patient's normal trend with past labs were reviewed.  Lipase is reassuring.  Urinalysis is reassuring, lactic acid is negative so we will cancel the second lactic as her WBC is normal and her lactic is normal.  CT abdomen/pelvis with IV contrast   CT abdomen/pelvis with IV  contrast independently reviewed and interpreted by me.  Patient has several loops of bowel within the hernia.  Radiologist comments noted strangulation obstruction.  I did explain this finding to the patient.  She is to follow-up with surgery, Dr. Peyton Najjar is on-call for to give her his phone number.  She appears to be stable for discharge.  She is to follow-up with her regular doctor for any other concerns.  Return emergency department worsening.  Take over in detail of concerns of incarcerated hernias.  Signs and symptoms.  Patient is to use MiraLAX to keep the stool soft and she will not have to strain.  She should not be lifting more than 10 pounds.  Patient is in agreement treatment plan.  Discharged stable condition.   FINAL CLINICAL IMPRESSION(S) / ED DIAGNOSES   Final diagnoses:  Ventral hernia without obstruction or gangrene     Rx / DC Orders   ED Discharge Orders     None        Note:  This document was prepared using Dragon voice recognition software and may include unintentional dictation errors.    Versie Starks, PA-C 12/21/21 1548    Rada Hay, MD 12/22/21 1352

## 2021-12-21 NOTE — Discharge Instructions (Signed)
Follow-up with Dr. Peyton Najjar.  Please call him for an appointment.  You have several loops of bowel that are in the hernia but they are not obstructed or twisted.  If you develop more pain in the abdomen you will need to return the emergency department.  Take a stool softener to prevent constipation.

## 2023-05-15 ENCOUNTER — Emergency Department
Admission: EM | Admit: 2023-05-15 | Discharge: 2023-05-16 | Disposition: A | Discharge status: Home / Self Care | Payer: 59 | Attending: Emergency Medicine | Admitting: Emergency Medicine

## 2023-05-15 ENCOUNTER — Other Ambulatory Visit: Payer: Self-pay

## 2023-05-15 DIAGNOSIS — I10 Essential (primary) hypertension: Secondary | ICD-10-CM | POA: Diagnosis not present

## 2023-05-15 DIAGNOSIS — R040 Epistaxis: Secondary | ICD-10-CM | POA: Diagnosis present

## 2023-05-15 LAB — CBC
HCT: 39.2 % (ref 36.0–46.0)
HCT: 36.5 % (ref 36.0–46.0)
Hemoglobin: 12.5 g/dL (ref 12.0–15.0)
Hemoglobin: 11.6 g/dL — ABNORMAL LOW (ref 12.0–15.0)
MCH: 26.4 pg (ref 26.0–34.0)
MCH: 26.9 pg (ref 26.0–34.0)
MCHC: 31.9 g/dL (ref 30.0–36.0)
MCHC: 31.8 g/dL (ref 30.0–36.0)
MCV: 82.9 fL (ref 80.0–100.0)
MCV: 84.7 fL (ref 80.0–100.0)
Platelets: 302 10*3/uL (ref 150–400)
Platelets: 290 10*3/uL (ref 150–400)
RBC: 4.73 MIL/uL (ref 3.87–5.11)
RBC: 4.31 MIL/uL (ref 3.87–5.11)
RDW: 15.5 % (ref 11.5–15.5)
RDW: 15.5 % (ref 11.5–15.5)
WBC: 7.5 10*3/uL (ref 4.0–10.5)
WBC: 7.7 10*3/uL (ref 4.0–10.5)
nRBC: 0 % (ref 0.0–0.2)
nRBC: 0 % (ref 0.0–0.2)

## 2023-05-15 LAB — BASIC METABOLIC PANEL
Anion gap: 12 (ref 5–15)
BUN: 19 mg/dL (ref 6–20)
CO2: 24 mmol/L (ref 22–32)
Calcium: 8.9 mg/dL (ref 8.9–10.3)
Chloride: 105 mmol/L (ref 98–111)
Creatinine, Ser: 0.77 mg/dL (ref 0.44–1.00)
GFR, Estimated: >60 mL/min (ref >60)
Glucose, Bld: 101 mg/dL — ABNORMAL HIGH (ref 70–99)
Potassium: 3.3 mmol/L — ABNORMAL LOW (ref 3.5–5.1)
Sodium: 141 mmol/L (ref 135–145)

## 2023-05-15 LAB — TROPONIN I (HIGH SENSITIVITY): Troponin I (High Sensitivity): 18 ng/L — ABNORMAL HIGH (ref <18)

## 2023-05-15 LAB — CBG MONITORING, ED: Glucose-Capillary: 96 mg/dL (ref 70–99)

## 2023-05-15 MED ORDER — SODIUM CHLORIDE 0.9 % IV BOLUS
1000.0000 mL | Freq: Once | INTRAVENOUS | Status: AC
  Administered 2023-05-15: 1000 mL via INTRAVENOUS

## 2023-05-15 MED ORDER — OXYMETAZOLINE HCL 0.05 % NA SOLN
1.0000 | Freq: Once | NASAL | Status: AC
  Administered 2023-05-15: 1 via NASAL
  Filled 2023-05-15: qty 30

## 2023-05-15 MED ORDER — TRANEXAMIC ACID 1000 MG/10ML IV SOLN: 500.0000 mg | Freq: Once | INTRAVENOUS | Status: DC

## 2023-05-15 MED ORDER — HYDRALAZINE HCL 20 MG/ML IJ SOLN
10.0000 mg | Freq: Once | INTRAMUSCULAR | Status: AC
  Administered 2023-05-15: 10 mg via INTRAVENOUS
  Filled 2023-05-15: qty 1

## 2023-05-15 NOTE — ED Provider Notes (Signed)
[facility] Provider Note    Event Date/Time   First MD Initiated Contact with Patient 05/15/23 2101     (approximate)   History   Epistaxis   HPI  Christina Sanders is a 43 y.o. female who presents to the emergency department today because of concerns for nosebleed.  Patient states that she started having a nosebleed yesterday.  She did manage to get it to stop however it started again today.  She states it felt like a a lot of blood.  She does have a history of high blood pressure and is inconsistent with taking her blood pressure medications.  She states she will take it about 3 times a week.  She normally feels pretty good she does not necessarily feel like she needs to take it.     Physical Exam   Triage Vital Signs: ED Triage Vitals [05/15/23 2016]  Encounter Vitals Group     BP (!) 210/110     Systolic BP Percentile      Diastolic BP Percentile      Pulse Rate 84     Resp 14     Temp 97.8 F (36.6 C)     Temp Source Oral     SpO2 100 %     Weight      Height 5\' 4"  (1.626 m)     Head Circumference      Peak Flow      Pain Score 0     Pain Loc      Pain Education      Exclude from Growth Chart     Most recent vital signs: Vitals:   05/15/23 2016  BP: (!) 210/110  Pulse: 84  Resp: 14  Temp: 97.8 F (36.6 C)  SpO2: 100%   General: Awake, alert, oriented. CV:  Good peripheral perfusion.  Resp:  Normal effort.  Abd:  No distention.  Other:  Dried blood in bilateral nares, more in left. No active bleeding at the time of my initial exam.    ED Results / Procedures / Treatments   Labs (all labs ordered are listed, but only abnormal results are displayed) Labs Reviewed  CBC - Abnormal; Notable for the following components:      Result Value   Hemoglobin 11.6 (*)    All other components within normal limits  CBC  BASIC METABOLIC PANEL  CBG MONITORING, ED  TROPONIN I (HIGH SENSITIVITY)     EKG  I, Phineas Semen,  attending physician, personally viewed and interpreted this EKG  EKG Time: 2254 Rate: 66 Rhythm: sinus rhythm Axis: normal Intervals: qtc 493 QRS: narrow, LVH ST changes: no st elevation Impression: abnormal ekg    RADIOLOGY None   PROCEDURES:  Critical Care performed: No   MEDICATIONS ORDERED IN ED: Medications  oxymetazoline (AFRIN) 0.05 % nasal spray 1 spray (1 spray Each Nare Given 05/15/23 2027)     IMPRESSION / MDM / ASSESSMENT AND PLAN / ED COURSE  I reviewed the triage vital signs and the nursing notes.                              Differential diagnosis includes, but is not limited to, nosebleed, hypertension  Patient's presentation is most consistent with acute presentation with potential threat to life or bodily function.   Patient presented to the emergency department today because of concerns for nosebleed.  Notably patient's blood pressure  was significantly elevated.  I did discuss with patient that that will make managing the nosebleed difficult.  However at the time my exam she does not have any active bleeding.  Will give medication to help bring down her blood pressure.  If the nosebleed restarts will consider packing or TXA.  Patient's nose did start bleeding again.  I was preparing TXA for packing when I was called in the patient's room by nursing staff.  The patient had had a syncopal episode.  Her blood pressure had gone low.  I did ask staff to start IV fluid bolus.  Additionally patient will have blood work rechecked.  At this time somewhat unclear why her blood pressure went so low.  She did receive the hydralazine but I also wonder if she might of had a vagal episode.  Shortly after starting fluids her blood pressure did improve significantly.  The patient did become more awake and alert.  I do think likely vasovagal given how quickly the patient recovered.    Repeat blood work shows a slight decrease in the hemoglobin which could be somewhat  dilutional given that she was given IV fluids, I do not think there is a significant change to have caused the low blood pressure.  I was able to pack the nose with TXA.  Will give it roughly 30 minutes and then see if there is any further bleeding.  Blood work showed troponin of 18. I discussed this with the patient. Did discuss that it was technically abnormal given normal range is less than 18 and that this could signify heart damage, however I do have low concern for ACS. Did discuss repeating it with the patient however at this time patient felt comfortable deferring, which I think is reasonable. Blood pressure remained elevated after the syncopal episode, at this time I do think likely vasovagal.  TXA packing was removed. No immediate bleeding. Did discuss with patient we will watch for roughly 30 minutes and if no further bleeding will plan on discharging. If she rebleeds we would have to send her home with packing.   FINAL CLINICAL IMPRESSION(S) / ED DIAGNOSES   Final diagnoses:  Epistaxis        Note:  This document was prepared using Dragon voice recognition software and may include unintentional dictation errors.    Phineas Semen, MD 05/16/23 [phone]

## 2023-05-15 NOTE — Discharge Instructions (Addendum)
Please seek medical attention for any high fevers, chest pain, shortness of breath, change in behavior, persistent vomiting, bloody stool or any other new or concerning symptoms.  Please note that your blood pressure was extremely high upon arrival.  It became very low after receiving some medication here in the emergency department, but then returned to an above normal level.  We recommend you follow-up with your regular doctor at the next available opportunity to discuss your uncontrolled hypertension and see if any outpatient changes need to be made to your medications.  If you start to have additional bleeding from your nose, please use the provided Afrin spray and pinch your nose shut firmly for at least 15 minutes without peeking.  We recommend you follow-up with Dr. Willeen Cass or one of his colleagues in the ENT clinic, particularly if you continue having issues with nosebleeds.

## 2023-05-15 NOTE — ED Provider Notes (Signed)
-----------------------------------------   11:27 PM on 05/15/2023 -----------------------------------------  Assuming care from Dr. Derrill Kay.  In short, Christina Sanders is a 43 y.o. female with a chief complaint of epistaxis, followed by syncope and mild chest pain in the setting of severe drop in blood pressure after hydralazine in an attempt to manage her uncontrolled hypertension.  Refer to the original H&P for additional details.  The current plan of care is to reassess after labs.   Clinical Course as of 05/16/23 0051  Mon May 16, 2023  0050 Reassessed patient.  No additional epistaxis, patient feels well, blood pressure has again improved.  She and her family are comfortable with the plan for discharge and outpatient follow-up.  I gave my usual and customary follow-up recommendations and return precautions. [CF]    Clinical Course User Index [CF] Loleta Rose, MD     Medications  tranexamic acid [facility]) injection 500 mg (0 mg Topical Hold 05/15/23 2306)  oxymetazoline (AFRIN) 0.05 % nasal spray 1 spray (1 spray Each Nare Given 05/15/23 2027)  hydrALAZINE (APRESOLINE) injection 10 mg (10 mg Intravenous Given 05/15/23 2212)  sodium chloride 0.9 % bolus 1,000 mL (0 mLs Intravenous Stopped 05/16/23 0031)     ED Discharge Orders     None      Final diagnoses:  Epistaxis  Uncontrolled hypertension     Loleta Rose, MD 05/16/23 [phone]

## 2023-05-15 NOTE — ED Triage Notes (Addendum)
Pt to ed from home via POV for nose bleed that started yesterday. Pt is caox4, in no acute distress and ambulatory in triage. Pt has some blood coming from her left tear duct as well. Pt denies any headache or other symptoms.

## 2023-05-15 NOTE — ED Provider Notes (Incomplete)
[facility] Provider Note    Event Date/Time   First MD Initiated Contact with Patient 05/15/23 2101     (approximate)   History   Epistaxis   HPI  Christina Sanders is a 43 y.o. female who presents to the emergency department today because of concerns for nosebleed.  Patient states that she started having a nosebleed yesterday.  She did manage to get it to stop however it started again today.  She states it felt like a a lot of blood.  She does have a history of high blood pressure and is inconsistent with taking her blood pressure medications.  She states she will take it about 3 times a week.  She normally feels pretty good she does not necessarily feel like she needs to take it.     Physical Exam   Triage Vital Signs: ED Triage Vitals [05/15/23 2016]  Encounter Vitals Group     BP (!) 210/110     Systolic BP Percentile      Diastolic BP Percentile      Pulse Rate 84     Resp 14     Temp 97.8 F (36.6 C)     Temp Source Oral     SpO2 100 %     Weight      Height 5\' 4"  (1.626 m)     Head Circumference      Peak Flow      Pain Score 0     Pain Loc      Pain Education      Exclude from Growth Chart     Most recent vital signs: Vitals:   05/15/23 2016  BP: (!) 210/110  Pulse: 84  Resp: 14  Temp: 97.8 F (36.6 C)  SpO2: 100%   General: Awake, alert, oriented. CV:  Good peripheral perfusion.  Resp:  Normal effort.  Abd:  No distention.  Other:  Dried blood in bilateral nares, more in left. No active bleeding at the time of my initial exam.    ED Results / Procedures / Treatments   Labs (all labs ordered are listed, but only abnormal results are displayed) Labs Reviewed  CBC - Abnormal; Notable for the following components:      Result Value   Hemoglobin 11.6 (*)    All other components within normal limits  CBC  BASIC METABOLIC PANEL  CBG MONITORING, ED  TROPONIN I (HIGH SENSITIVITY)     EKG  I, Phineas Semen,  attending physician, personally viewed and interpreted this EKG  EKG Time: 2254 Rate: 66 Rhythm: sinus rhythm Axis: normal Intervals: qtc 493 QRS: narrow, LVH ST changes: no st elevation Impression: abnormal ekg    RADIOLOGY None   PROCEDURES:  Critical Care performed: No   MEDICATIONS ORDERED IN ED: Medications  oxymetazoline (AFRIN) 0.05 % nasal spray 1 spray (1 spray Each Nare Given 05/15/23 2027)     IMPRESSION / MDM / ASSESSMENT AND PLAN / ED COURSE  I reviewed the triage vital signs and the nursing notes.                              Differential diagnosis includes, but is not limited to, nosebleed, hypertension  Patient's presentation is most consistent with acute presentation with potential threat to life or bodily function.   Patient presented to the emergency department today because of concerns for nosebleed.  Notably patient's blood pressure  was significantly elevated.  I did discuss with patient that that will make managing the nosebleed difficult.  However at the time my exam she does not have any active bleeding.  Will give medication to help bring down her blood pressure.  If the nosebleed restarts will consider packing or TXA.  Patient's nose did start bleeding again.  I was preparing TXA for packing when I was called in the patient's room by nursing staff.  The patient had had a syncopal episode.  Her blood pressure had gone low.  I did ask staff to start IV fluid bolus.  Additionally patient will have blood work rechecked.  At this time somewhat unclear why her blood pressure went so low.  She did receive the hydralazine but I also wonder if she might of had a vagal episode.  Shortly after starting fluids her blood pressure did improve significantly.  The patient did become more awake and alert.  I do think likely vasovagal given how quickly the patient recovered.    Repeat blood work shows a slight decrease in the hemoglobin which could be somewhat  dilutional given that she was given IV fluids, I do not think there is a significant change to have caused the low blood pressure.  I was able to pack the nose with TXA.  Will give it roughly 30 minutes and then see if there is any further bleeding.   FINAL CLINICAL IMPRESSION(S) / ED DIAGNOSES   Final diagnoses:  None     Rx / DC Orders   ED Discharge Orders     None        Note:  This document was prepared using Dragon voice recognition software and may include unintentional dictation errors.

## 2023-05-16 NOTE — ED Notes (Signed)
..  The patient is A&OX4, ambulatory at d/c with independent steady gait but was wheeled out of ED via wheelchair, NAD. Pt verbalized understanding of d/c instructions and follow up care.

## 2023-05-17 ENCOUNTER — Emergency Department (HOSPITAL_COMMUNITY)
Admission: EM | Admit: 2023-05-17 | Discharge: 2023-05-18 | Disposition: A | Discharge status: Home / Self Care | Payer: 59 | Attending: Emergency Medicine | Admitting: Emergency Medicine

## 2023-05-17 ENCOUNTER — Emergency Department (HOSPITAL_COMMUNITY): Payer: 59

## 2023-05-17 ENCOUNTER — Other Ambulatory Visit: Payer: Self-pay

## 2023-05-17 DIAGNOSIS — R040 Epistaxis: Secondary | ICD-10-CM | POA: Insufficient documentation

## 2023-05-17 DIAGNOSIS — I10 Essential (primary) hypertension: Secondary | ICD-10-CM | POA: Diagnosis present

## 2023-05-17 DIAGNOSIS — Z79899 Other long term (current) drug therapy: Secondary | ICD-10-CM | POA: Insufficient documentation

## 2023-05-17 LAB — CBC
HCT: 36.3 % (ref 36.0–46.0)
Hemoglobin: 11.5 g/dL — ABNORMAL LOW (ref 12.0–15.0)
MCH: 27 pg (ref 26.0–34.0)
MCHC: 31.7 g/dL (ref 30.0–36.0)
MCV: 85.2 fL (ref 80.0–100.0)
Platelets: 270 10*3/uL (ref 150–400)
RBC: 4.26 MIL/uL (ref 3.87–5.11)
RDW: 15.9 % — ABNORMAL HIGH (ref 11.5–15.5)
WBC: 7.6 10*3/uL (ref 4.0–10.5)
nRBC: 0 % (ref 0.0–0.2)

## 2023-05-17 LAB — BASIC METABOLIC PANEL
Anion gap: 14 (ref 5–15)
BUN: 14 mg/dL (ref 6–20)
CO2: 21 mmol/L — ABNORMAL LOW (ref 22–32)
Calcium: 9 mg/dL (ref 8.9–10.3)
Chloride: 107 mmol/L (ref 98–111)
Creatinine, Ser: 0.84 mg/dL (ref 0.44–1.00)
GFR, Estimated: >60 mL/min (ref >60)
Glucose, Bld: 91 mg/dL (ref 70–99)
Potassium: 3.4 mmol/L — ABNORMAL LOW (ref 3.5–5.1)
Sodium: 142 mmol/L (ref 135–145)

## 2023-05-17 LAB — TROPONIN I (HIGH SENSITIVITY)
Troponin I (High Sensitivity): 15 ng/L (ref <18)
Troponin I (High Sensitivity): 14 ng/L (ref <18)

## 2023-05-17 LAB — HCG, SERUM, QUALITATIVE: Preg, Serum: NEGATIVE

## 2023-05-17 MED ORDER — AMLODIPINE BESYLATE 5 MG PO TABS
5.0000 mg | ORAL_TABLET | Freq: Once | ORAL | Status: AC
  Administered 2023-05-18: 5 mg via ORAL
  Filled 2023-05-17: qty 1

## 2023-05-17 MED ORDER — IBUPROFEN 400 MG PO TABS
400.0000 mg | ORAL_TABLET | Freq: Once | ORAL | Status: AC | PRN
  Administered 2023-05-17: 400 mg via ORAL
  Filled 2023-05-17 (×2): qty 1

## 2023-05-17 NOTE — ED Notes (Addendum)
Pt started having a nosebleed. Pt reassessed by this RN. Vitals as noted. Pt denies CP or ShOB. Only complains of a HA at this time. Pt has no neuro deficits at present.

## 2023-05-17 NOTE — ED Triage Notes (Signed)
Patient reports central chest pain with mild SOB , fatigue and headache onset this week , hypertensive , no emesis or diaphoresis .

## 2023-05-17 NOTE — ED Provider Notes (Signed)
[place] EMERGENCY DEPARTMENT AT [facility] Provider Note   CSN: [id number] Arrival date & time: 05/17/23  1859     History {Add pertinent medical, surgical, social history, OB history to HPI:1} Chief Complaint  Patient presents with   Chest Pain   Hypertension    Christina Sanders is a 43 y.o. female.  The history is provided by the patient and medical records.  Chest Pain Hypertension Associated symptoms include chest pain.   43 y.o. F with hx of obesity, HTN, hodgkins lymphoma in remission, presenting to the ED for HTN.  Patient has hx of same, has been non-compliant with medications in the past but has been taking consistently over the past few months.  States BP readings remain 180-190 systolic range despite taking her medications.  She has started to have some headaches and intermittent nosebleeds.  She was seen at [facility] last night for same-- given hydralazine which helped her BP follow in the hospital she returns for follow-up of elevated BP and heart rate.  Today BP elevated once again even after taking home meds.  Nosebleed early this AM and again while in the lobby.  Home Medications Prior to Admission medications   Medication Sig Start Date End Date Taking? Authorizing Provider  amLODipine (NORVASC) 5 MG tablet Take 1 tablet (5 mg total) by mouth daily. 07/10/20 12/21/21  Lynn Ito, MD  azithromycin (ZITHROMAX) 250 MG tablet Take 1 tablet by mouth daily. 12/17/21   [provider]  fluticasone (FLONASE) 50 MCG/ACT nasal spray Place 1 spray into both nostrils 2 (two) times daily. 12/17/21   [provider]  neomycin-polymyxin b-dexamethasone (MAXITROL) 3.5-10000-0.1 SUSP Place 1 drop into both eyes 3 (three) times daily. Patient not taking: Reported on 12/21/2021 12/15/21   [provider]      Allergies    Heparin    Review of Systems   Review of Systems  Cardiovascular:  Positive for chest pain.  All other systems reviewed and are  negative.   Physical Exam Updated Vital Signs BP (!) 189/102 (BP Location: Left Wrist)   Pulse 79   Temp 98.3 F (36.8 C) (Oral)   Resp 15   SpO2 100%   Physical Exam Vitals and nursing note reviewed.  Constitutional:      Appearance: She is well-developed.  HENT:     Head: Normocephalic and atraumatic.  Eyes:     Conjunctiva/sclera: Conjunctivae normal.     Pupils: Pupils are equal, round, and reactive to light.  Cardiovascular:     Rate and Rhythm: Normal rate and regular rhythm.     Heart sounds: Normal heart sounds.  Pulmonary:     Effort: Pulmonary effort is normal.     Breath sounds: Normal breath sounds.  Abdominal:     General: Bowel sounds are normal.     Palpations: Abdomen is soft.  Musculoskeletal:        General: Normal range of motion.     Cervical back: Normal range of motion.  Skin:    General: Skin is warm and dry.  Neurological:     Mental Status: She is alert and oriented to person, place, and time.     ED Results / Procedures / Treatments   Labs (all labs ordered are listed, but only abnormal results are displayed) Labs Reviewed  BASIC METABOLIC PANEL - Abnormal; Notable for the following components:      Result Value   Potassium 3.4 (*)    CO2 21 (*)  All other components within normal limits  CBC - Abnormal; Notable for the following components:   Hemoglobin 11.5 (*)    RDW 15.9 (*)    All other components within normal limits  HCG, SERUM, QUALITATIVE  TROPONIN I (HIGH SENSITIVITY)  TROPONIN I (HIGH SENSITIVITY)    EKG None  Radiology CT Head Wo Contrast Result Date: 05/17/2023 CLINICAL DATA:  Headache, increasing frequency or severity. EXAM: CT HEAD WITHOUT CONTRAST TECHNIQUE: Contiguous axial images were obtained from the base of the skull through the vertex without intravenous contrast. RADIATION DOSE REDUCTION: This exam was performed according to the departmental dose-optimization program which includes automated exposure  control, adjustment of the mA and/or kV according to patient size and/or use of iterative reconstruction technique. COMPARISON:  None Available. FINDINGS: Brain: The brain shows a normal appearance without evidence of malformation, atrophy, old or acute small or large vessel infarction, mass lesion, hemorrhage, hydrocephalus or extra-axial collection. Vascular: No hyperdense vessel. No evidence of atherosclerotic calcification. Skull: Normal.  No traumatic finding.  No focal bone lesion. Sinuses/Orbits: Sinuses are clear. Orbits appear normal. Mastoids are clear. Other: None significant IMPRESSION: Normal head CT. Electronically Signed   By: Paulina Fusi M.D.   On: 05/17/2023 21:54   DG Chest Port 1 View Result Date: 05/17/2023 CLINICAL DATA:  Chest pain.  Hypertension. EXAM: PORTABLE CHEST 1 VIEW COMPARISON:  Radiograph and CT 07/07/2020 FINDINGS: Upper normal heart size.The cardiomediastinal contours are normal. The lungs are clear. Pulmonary vasculature is normal. No consolidation, pleural effusion, or pneumothorax. No acute osseous abnormalities are seen. IMPRESSION: Upper normal heart size. No acute findings. Electronically Signed   By: Narda Rutherford M.D.   On: 05/17/2023 20:40    Procedures Procedures  {Document cardiac monitor, telemetry assessment procedure when appropriate:1}  Medications Ordered in ED Medications  amLODipine (NORVASC) tablet 5 mg (has no administration in time range)  ibuprofen (ADVIL) tablet 400 mg (400 mg Oral Given 05/17/23 2124)    ED Course/ Medical Decision Making/ A&P   {   Click here for ABCD2, HEART and other calculatorsREFRESH Note before signing :1}                              Medical Decision Making Risk Prescription drug management.   ***  {Document critical care time when appropriate:1} {Document review of labs and clinical decision tools ie heart score, Chads2Vasc2 etc:1}  {Document your independent review of radiology images, and any outside  records:1} {Document your discussion with family members, caretakers, and with consultants:1} {Document social determinants of health affecting pt's care:1} {Document your decision making why or why not admission, treatments were needed:1} Final Clinical Impression(s) / ED Diagnoses Final diagnoses:  None    Rx / DC Orders ED Discharge Orders     None

## 2023-05-17 NOTE — ED Provider Triage Note (Signed)
Emergency Medicine Provider Triage Evaluation Note  Christina Sanders , a 43 y.o. female  was evaluated in triage.  Pt complains of chest pain and hypertension.  States this started this past Saturday.  States she was having nosebleed, chest pain and hypertension.  She went to [facility] regional ED.  Was evaluated there.  Was discharged after evaluation.  Patient states she is been on amlodipine for about a year now and sometimes misses doses.  Chest pain has been intermittent since her evaluation, it is in the center of the chest with some intermittent shortness of breath.  At this time she does not have chest pain but does report a headache but no visual disturbance.  States in the last few days her systolic blood pressure has been 180s to 190s.  Review of Systems  Positive: See above Negative: See above  Physical Exam  BP (!) 184/123 (BP Location: Right Arm)   Pulse 79   Temp 98.1 F (36.7 C) (Oral)   Resp 20   SpO2 100%  Gen:   Awake, no distress   Resp:  Normal effort  MSK:   Moves extremities without difficulty  Other:    Medical Decision Making  Medically screening exam initiated at 7:22 PM.  Appropriate orders placed.  Christina Sanders was informed that the remainder of the evaluation will be completed by another provider, this initial triage assessment does not replace that evaluation, and the importance of remaining in the ED until their evaluation is complete.  Work up started   Gareth Eagle, [place] 05/17/23 1924

## 2023-05-18 MED ORDER — AMLODIPINE BESYLATE 5 MG PO TABS: 5.0000 mg | ORAL_TABLET | Freq: Two times a day (BID) | ORAL | 0 refills | Status: AC

## 2023-05-18 NOTE — Discharge Instructions (Addendum)
Your labs, chest-xray and CT today were normal. We are increasing your amlodipine to 10mg  daily-- you can take this all at once or split it into 5mg  twice daily to see how your body responds.  Just make sure to take the same way each day. Keep a check on your BP-- check no more than twice a day, preferably same time each day. Follow-up with your primary care doctor as soon as you can. Return to the ED for new or worsening symptoms.

## 2024-01-17 ENCOUNTER — Other Ambulatory Visit: Payer: Self-pay | Admitting: Gastroenterology

## 2024-02-03 ENCOUNTER — Encounter (HOSPITAL_COMMUNITY): Payer: Self-pay | Admitting: Gastroenterology

## 2024-02-10 ENCOUNTER — Other Ambulatory Visit: Payer: Self-pay

## 2024-02-10 ENCOUNTER — Ambulatory Visit (HOSPITAL_COMMUNITY)
Admission: RE | Admit: 2024-02-10 | Discharge: 2024-02-10 | Disposition: A | Discharge status: Home / Self Care | Attending: Gastroenterology | Admitting: Gastroenterology

## 2024-02-10 ENCOUNTER — Encounter (HOSPITAL_COMMUNITY)
Admission: RE | Disposition: A | Discharge status: Home / Self Care | Payer: Self-pay | Source: Home / Self Care | Attending: Gastroenterology

## 2024-02-10 ENCOUNTER — Ambulatory Visit (HOSPITAL_COMMUNITY): Payer: Self-pay

## 2024-02-10 ENCOUNTER — Ambulatory Visit (HOSPITAL_BASED_OUTPATIENT_CLINIC_OR_DEPARTMENT_OTHER): Payer: Self-pay

## 2024-02-10 ENCOUNTER — Encounter (HOSPITAL_COMMUNITY): Payer: Self-pay | Admitting: Gastroenterology

## 2024-02-10 DIAGNOSIS — D120 Benign neoplasm of cecum: Secondary | ICD-10-CM | POA: Diagnosis not present

## 2024-02-10 DIAGNOSIS — K635 Polyp of colon: Secondary | ICD-10-CM | POA: Insufficient documentation

## 2024-02-10 DIAGNOSIS — D123 Benign neoplasm of transverse colon: Secondary | ICD-10-CM | POA: Diagnosis not present

## 2024-02-10 DIAGNOSIS — D122 Benign neoplasm of ascending colon: Secondary | ICD-10-CM

## 2024-02-10 DIAGNOSIS — Z6841 Body Mass Index (BMI) 40.0 and over, adult: Secondary | ICD-10-CM | POA: Insufficient documentation

## 2024-02-10 DIAGNOSIS — I10 Essential (primary) hypertension: Secondary | ICD-10-CM | POA: Insufficient documentation

## 2024-02-10 DIAGNOSIS — E6689 Other obesity not elsewhere classified: Secondary | ICD-10-CM | POA: Insufficient documentation

## 2024-02-10 DIAGNOSIS — Z8572 Personal history of non-Hodgkin lymphomas: Secondary | ICD-10-CM | POA: Insufficient documentation

## 2024-02-10 DIAGNOSIS — Z1211 Encounter for screening for malignant neoplasm of colon: Secondary | ICD-10-CM | POA: Insufficient documentation

## 2024-02-10 HISTORY — PX: COLONOSCOPY: SHX5424

## 2024-02-10 SURGERY — COLONOSCOPY
Anesthesia: Monitor Anesthesia Care

## 2024-02-10 MED ORDER — PROPOFOL 500 MG/50ML IV EMUL
INTRAVENOUS | Status: DC | PRN
  Administered 2024-02-10: 140 mg via INTRAVENOUS
  Administered 2024-02-10: 125 ug/kg/min via INTRAVENOUS

## 2024-02-10 MED ORDER — SODIUM CHLORIDE 0.9 % IV SOLN: INTRAVENOUS | Status: DC

## 2024-02-10 NOTE — Anesthesia Preprocedure Evaluation (Signed)
 Anesthesia Evaluation  Patient identified by MRN, date of birth, ID band Patient awake    Reviewed: Allergy & Precautions, H&P , NPO status , Patient's Chart, lab work & pertinent test results  History of Anesthesia Complications Negative for: history of anesthetic complications  Airway Mallampati: II  TM Distance: >3 FB Neck ROM: Full    Dental no notable dental hx.    Pulmonary neg pulmonary ROS   Pulmonary exam normal breath sounds clear to auscultation       Cardiovascular hypertension, Normal cardiovascular exam Rhythm:Regular Rate:Normal     Neuro/Psych neg Seizures negative neurological ROS  negative psych ROS   GI/Hepatic negative GI ROS, Neg liver ROS,,,  Endo/Other    Class 4 obesity  Renal/GU negative Renal ROS  negative genitourinary   Musculoskeletal negative musculoskeletal ROS (+)    Abdominal  (+) + obese  Peds negative pediatric ROS (+)  Hematology negative hematology ROS (+) Non Hodgkin's lymphoma   Anesthesia Other Findings   Reproductive/Obstetrics negative OB ROS                              Anesthesia Physical Anesthesia Plan  ASA: 3  Anesthesia Plan: MAC   Post-op Pain Management:    Induction: Intravenous  PONV Risk Score and Plan: 2 and Propofol  infusion and Treatment may vary due to age or medical condition  Airway Management Planned: Natural Airway  Additional Equipment:   Intra-op Plan:   Post-operative Plan:   Informed Consent: I have reviewed the patients History and Physical, chart, labs and discussed the procedure including the risks, benefits and alternatives for the proposed anesthesia with the patient or authorized representative who has indicated his/her understanding and acceptance.     Dental advisory given  Plan Discussed with: CRNA  Anesthesia Plan Comments:         Anesthesia Quick Evaluation

## 2024-02-10 NOTE — Discharge Instructions (Signed)

## 2024-02-10 NOTE — H&P (Signed)
 Christina Sanders HPI: At this time the patient denies any problems with nausea, vomiting, fevers, chills, abdominal pain, diarrhea, constipation, hematochezia, melena, GERD, or dysphagia. The patient denies any known family history of colon cancers. No complaints of chest pain, SOB, MI, or sleep apnea.  As a sophomore in college she was diagnosed with NHL secondary to [place].  The patient was cured of the disease.  Past Medical History:  Diagnosis Date   Cancer (HCC)    Non Hodgkin's lymphoma (HCC)    Personal history of chemotherapy     Past Surgical History:  Procedure Laterality Date   BREAST BIOPSY Left 09/04/2019   us  biopsy, venus clip, path pending    BREAST BIOPSY Left 09/04/2019   Node biopsy, butterfly clip, path pending    BREAST BIOPSY Left 05/21/2020   us  bx/ coil clip/ path pending   UMBILICAL HERNIA REPAIR N/A 07/06/2020   Procedure: HERNIA REPAIR UMBILICAL ADULT;  Surgeon: Marolyn Nest, MD;  Location: [facility] ORS;  Service: General;  Laterality: N/A;    History reviewed. No pertinent family history.  Social History:  reports that she has never smoked. She has never used smokeless tobacco. She reports that she does not drink alcohol and does not use drugs.  Allergies:  Allergies  Allergen Reactions   Heparin Nausea And Vomiting    Medications: Scheduled: Continuous:  sodium chloride       No results found for this or any previous visit (from the past 24 hours).   No results found.  ROS:  As stated above in the HPI otherwise negative.  Blood pressure 137/68, pulse 76, temperature 97.9 F (36.6 C), temperature source Temporal, resp. rate 20, height 5' 4 (1.626 m), weight 133.8 kg, SpO2 98%, unknown if currently breastfeeding.    PE: Gen: NAD, Alert and Oriented HEENT:  [place]/AT, EOMI Neck: Supple, no LAD Lungs: CTA Bilaterally CV: RRR without M/G/R ABD: Soft, NTND, +BS Ext: No C/C/E  Assessment/Plan: 1) Screening colonoscopy.  Christina Sanders  D 02/10/2024, 7:22 AM

## 2024-02-10 NOTE — Anesthesia Postprocedure Evaluation (Signed)
 Anesthesia Post Note  Patient: Christina Sanders  Procedure(s) Performed: COLONOSCOPY     Patient location during evaluation: PACU Anesthesia Type: MAC Level of consciousness: awake and alert Pain management: pain level controlled Vital Signs Assessment: post-procedure vital signs reviewed and stable Respiratory status: spontaneous breathing, nonlabored ventilation, respiratory function stable and patient connected to nasal cannula oxygen Cardiovascular status: stable and blood pressure returned to baseline Postop Assessment: no apparent nausea or vomiting Anesthetic complications: no   No notable events documented.  Last Vitals:  Vitals:   02/10/24 0830 02/10/24 0835  BP: 120/69 118/65  Pulse: 73 61  Resp: (!) 21 16  Temp:    SpO2: 96% 99%    Last Pain:  Vitals:   02/10/24 0835  TempSrc:   PainSc: 0-No pain                 Thom JONELLE Peoples

## 2024-02-10 NOTE — Transfer of Care (Signed)
 Immediate Anesthesia Transfer of Care Note  Patient: Christina Sanders  Procedure(s) Performed: COLONOSCOPY  Patient Location: PACU  Anesthesia Type:MAC  Level of Consciousness: oriented and drowsy  Airway & Oxygen Therapy: Patient Spontanous Breathing  Post-op Assessment: Report given to RN and Post -op Vital signs reviewed and stable  Post vital signs: Reviewed and stable  Last Vitals:  Vitals Value Taken Time  BP 105/57 02/10/24 08:20  Temp 36.8 C 02/10/24 08:18  Pulse 67 02/10/24 08:25  Resp 17 02/10/24 08:25  SpO2 95 % 02/10/24 08:25  Vitals shown include unfiled device data.  Last Pain:  Vitals:   02/10/24 0820  TempSrc:   PainSc: 0-No pain         Complications: No notable events documented.

## 2024-02-10 NOTE — Op Note (Signed)
 [facility] Patient Name: Christina Sanders Procedure Date: 02/10/2024 MRN: [id number] Attending MD: Belvie Just , MD, [id number] Date of Birth: 04-04-1980 CSN: [id number] Age: 43 Admit Type: Outpatient Procedure:                Colonoscopy Indications:              Screening for colorectal malignant neoplasm Providers:                Belvie Just, MD, Clotilda Schmitz, RN, Felice Sar,                            Technician Referring MD:             Belvie Just, MD Medicines:                Propofol  per Anesthesia Complications:            No immediate complications. Estimated Blood Loss:     Estimated blood loss: none. Procedure:                Pre-Anesthesia Assessment:                           - Prior to the procedure, a History and Physical                            was performed, and patient medications and                            allergies were reviewed. The patient's tolerance of                            previous anesthesia was also reviewed. The risks                            and benefits of the procedure and the sedation                            options and risks were discussed with the patient.                            All questions were answered, and informed consent                            was obtained. Prior Anticoagulants: The patient has                            taken no anticoagulant or antiplatelet agents. ASA                            Grade Assessment: III - A patient with severe                            systemic disease. After reviewing the risks and  benefits, the patient was deemed in satisfactory                            condition to undergo the procedure.                           - Sedation was administered by an anesthesia                            professional. Deep sedation was attained.                           After obtaining informed consent, the colonoscope                            was passed  under direct vision. Throughout the                            procedure, the patient's blood pressure, pulse, and                            oxygen saturations were monitored continuously. The                            CF-HQ190L ([id number] Olympus colonoscope was                            introduced through the anus and advanced to the the                            cecum, identified by appendiceal orifice and                            ileocecal valve. The colonoscopy was performed                            without difficulty. The patient tolerated the                            procedure well. The quality of the bowel                            preparation was evaluated using the BBPS [facility]                            Bowel Preparation Scale) with scores of: Right                            Colon = 3 (entire mucosa seen well with no residual                            staining, small fragments of stool or opaque  liquid), Transverse Colon = 2 (minor amount of                            residual staining, small fragments of stool and/or                            opaque liquid, but mucosa seen well) and Left Colon                            = 2 (minor amount of residual staining, small                            fragments of stool and/or opaque liquid, but mucosa                            seen well). The total BBPS score equals 7. The                            quality of the bowel preparation was good. The                            ileocecal valve, appendiceal orifice, and rectum                            were photographed. Scope In: 7:43:06 AM Scope Out: 8:10:02 AM Scope Withdrawal Time: 0 hours 18 minutes 15 seconds  Total Procedure Duration: 0 hours 26 minutes 56 seconds  Findings:      Two sessile polyps were found in the ascending colon and cecum. The       polyps were 2 to 3 mm in size. These polyps were removed with a cold       snare. Resection  and retrieval were complete.      A 10 mm polyp was found in the transverse colon. The polyp was       pedunculated. The polyp was removed with a hot snare. Resection and       retrieval were complete. Impression:               - Two 2 to 3 mm polyps in the ascending colon and                            in the cecum, removed with a cold snare. Resected                            and retrieved.                           - One 10 mm polyp in the transverse colon, removed                            with a hot snare. Resected and retrieved. Moderate Sedation:      Not Applicable - Patient had care per Anesthesia. Recommendation:           - Patient has a contact number available  for                            emergencies. The signs and symptoms of potential                            delayed complications were discussed with the                            patient. Return to normal activities tomorrow.                            Written discharge instructions were provided to the                            patient.                           - Resume previous diet.                           - Continue present medications.                           - Await pathology results.                           - Repeat colonoscopy in 3 years for surveillance. Procedure Code(s):        --- Professional ---                           [phone], Colonoscopy, flexible; with removal of                            tumor(s), polyp(s), or other lesion(s) by snare                            technique Diagnosis Code(s):        --- Professional ---                           Z12.11, Encounter for screening for malignant                            neoplasm of colon                           D12.2, Benign neoplasm of ascending colon                           D12.0, Benign neoplasm of cecum                           D12.3, Benign neoplasm of transverse colon (hepatic                            flexure or splenic  flexure) CPT copyright 2022 [employer]  Association. All rights reserved. The codes documented in this report are preliminary and upon coder review may  be revised to meet current compliance requirements. Belvie Just, MD Belvie Just, MD 02/10/2024 8:23:15 AM This report has been signed electronically. Number of Addenda: 0

## 2024-02-12 ENCOUNTER — Encounter (HOSPITAL_COMMUNITY): Payer: Self-pay | Admitting: Gastroenterology

## 2024-02-13 LAB — SURGICAL PATHOLOGY
